# Patient Record
Sex: Female | Born: 1975 | Hispanic: No | Smoking: Current every day smoker
Health system: Southern US, Community
[De-identification: ages and names within clinical notes are randomized; demographics above are authoritative.]

## PROBLEM LIST (undated history)

## (undated) DIAGNOSIS — J45909 Unspecified asthma, uncomplicated: Secondary | ICD-10-CM

## (undated) HISTORY — PX: WISDOM TOOTH EXTRACTION: SHX21

## (undated) HISTORY — PX: CHOLECYSTECTOMY: SHX55

## (undated) HISTORY — PX: TONSILLECTOMY: SUR1361

---
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
## 1996-05-06 HISTORY — PX: WISDOM TOOTH EXTRACTION: SHX21

## 2002-05-06 HISTORY — PX: CHOLECYSTECTOMY: SHX55

## 2012-09-02 ENCOUNTER — Ambulatory Visit: Payer: Self-pay | Admitting: Family Medicine

## 2013-11-19 DIAGNOSIS — J302 Other seasonal allergic rhinitis: Secondary | ICD-10-CM | POA: Insufficient documentation

## 2013-11-19 DIAGNOSIS — E669 Obesity, unspecified: Secondary | ICD-10-CM | POA: Insufficient documentation

## 2014-01-07 DIAGNOSIS — F419 Anxiety disorder, unspecified: Secondary | ICD-10-CM | POA: Insufficient documentation

## 2014-06-07 IMAGING — CR RIGHT ANKLE - COMPLETE 3+ VIEW
1 series · 5 of 5 positions shown · non-contrast
Comparison: none

REASON FOR EXAM: R ankle pain and swelling due to fall
COMMENTS:

PROCEDURE:     MDR - MDR ANKLE RIGHT COMPLETE  - September 02, 2012  [DATE]
RESULT:     Comparison: None

[Series 1: ap · 0.17mm/px · 5 of 5 slices shown]
[im 1/5]
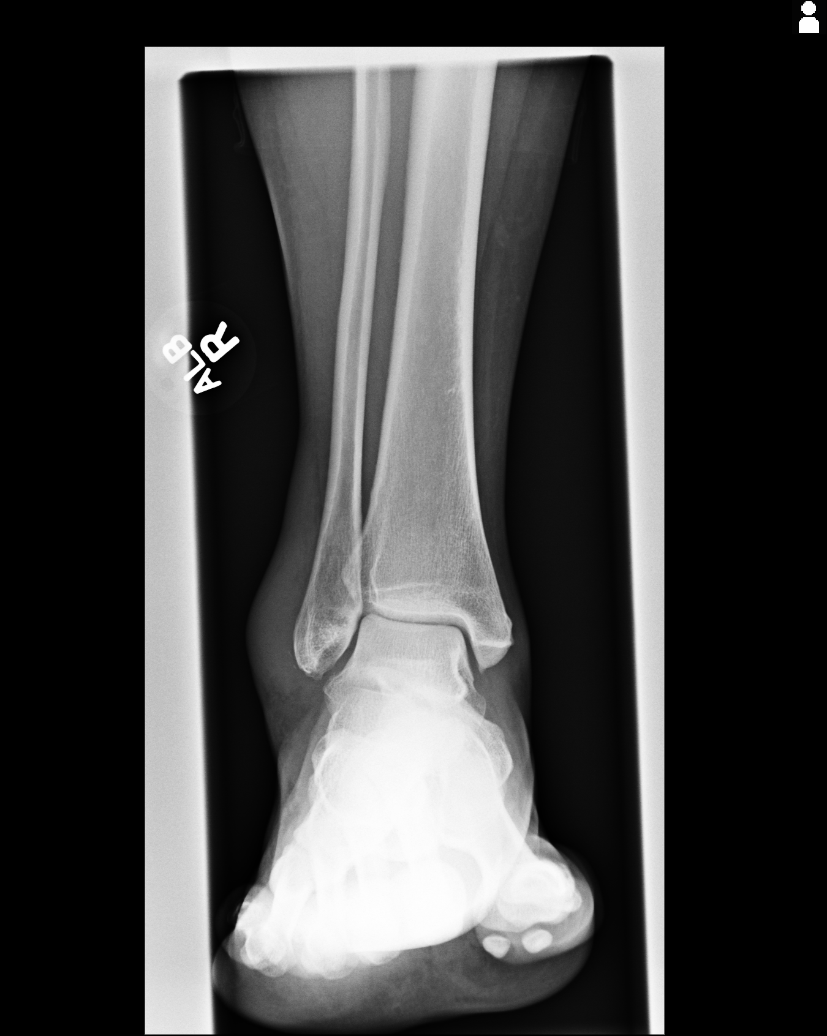
[im 2/5]
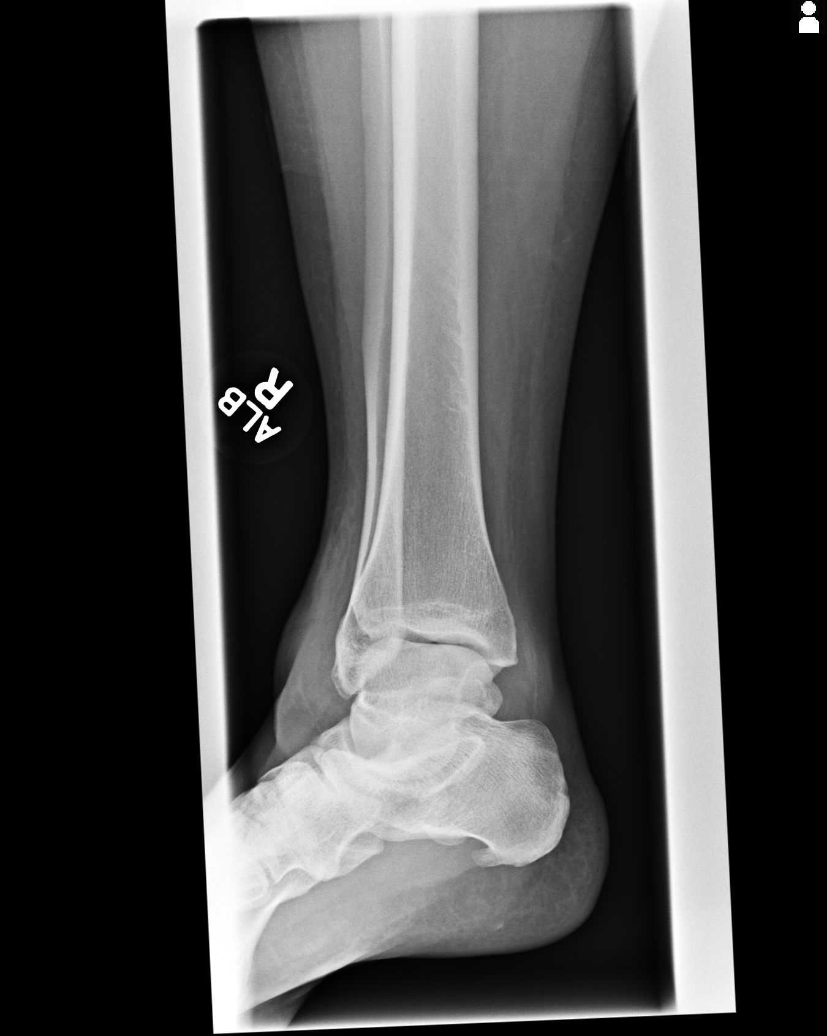
[im 3/5]
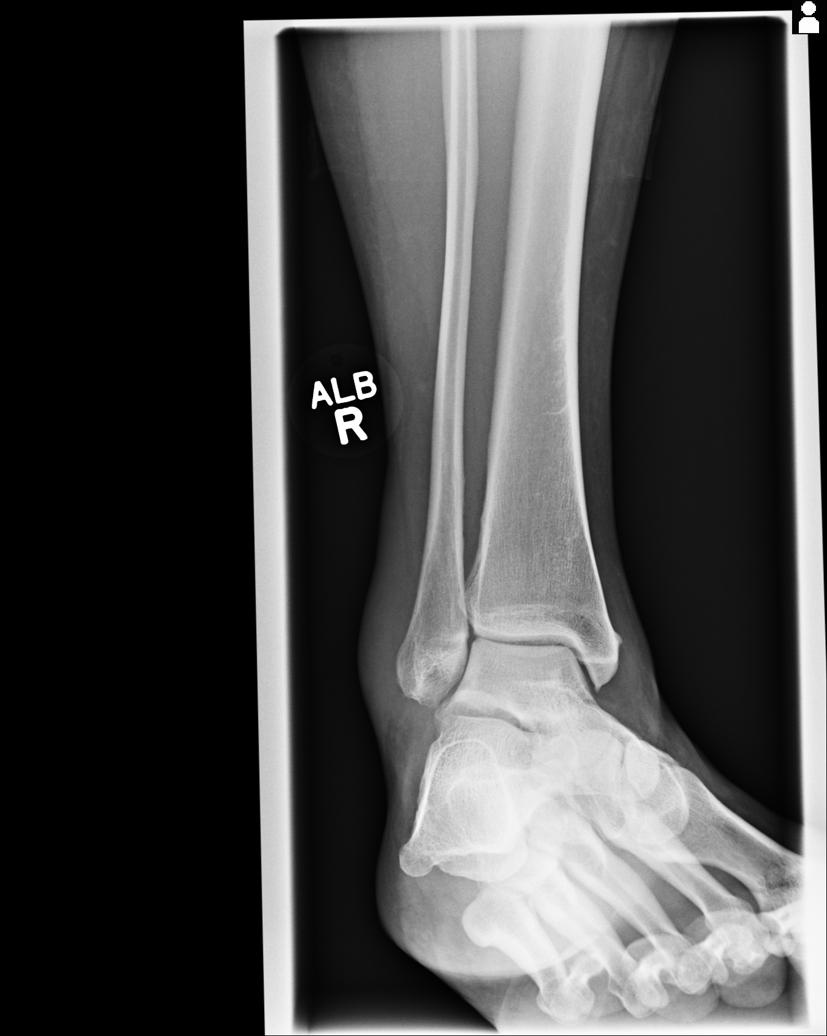
[im 4/5]
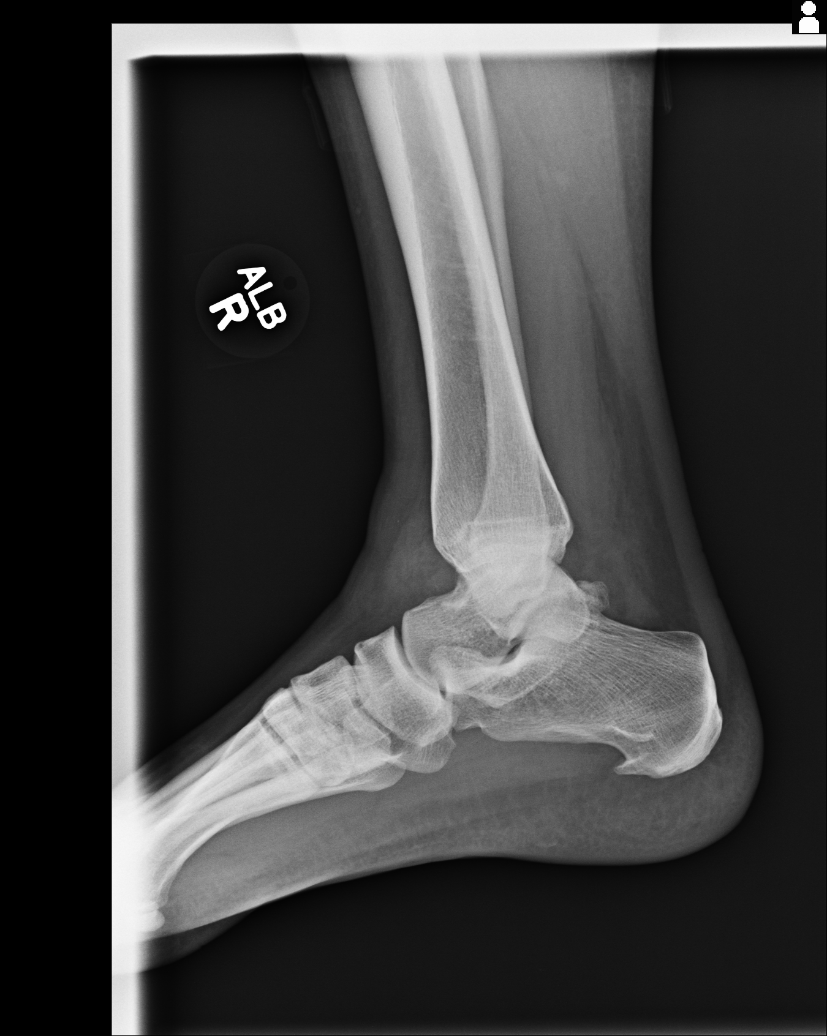
[im 5/5]
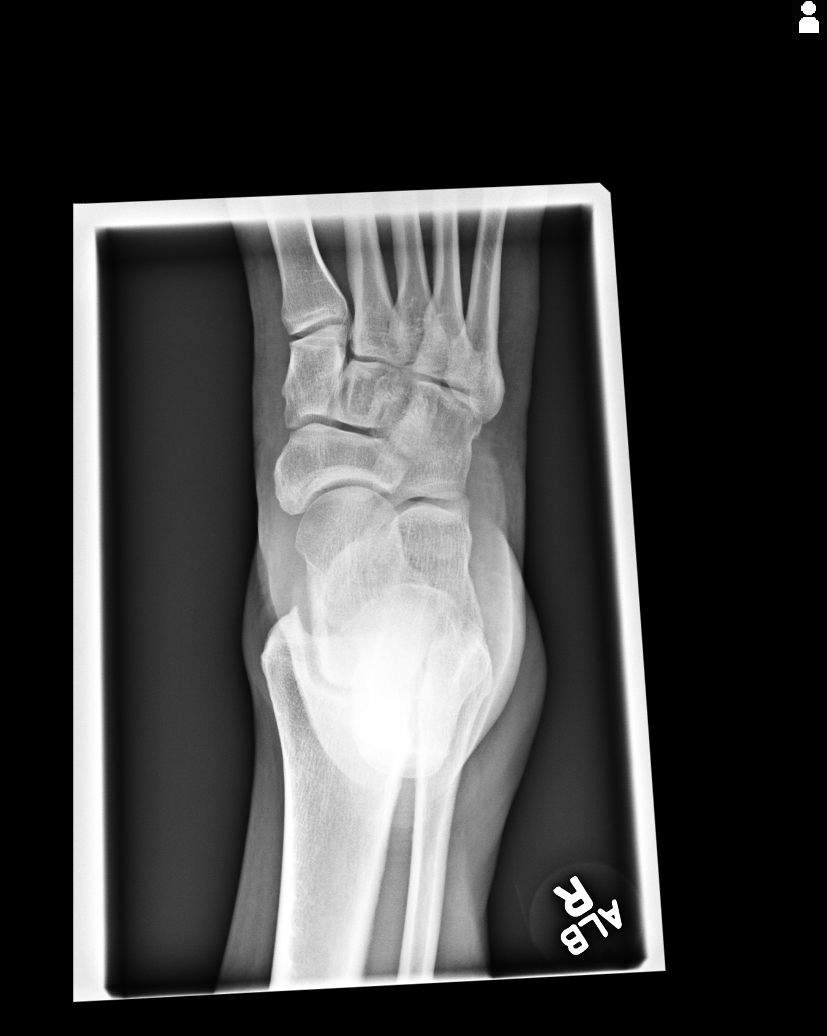

[5 of 5 positions shown; findings below may reference images not displayed]

FINDINGS: 5 views of the right ankle demonstrate 2 punctate ossific foci adjacent to
the lateral malleolus with severe overlying soft tissue swelling likely
representing avulsive injury. There is no other fracture or dislocation.
There is a plantar calcaneal spur. There ankle mortise is intact. There is
no significant joint effusion. Severe soft tissue swelling over the lateral
malleolus.
IMPRESSION: 2 punctate ossific foci adjacent to the lateral malleolus with severe
overlying soft tissue swelling likely representing avulsive injury.

[REDACTED]

## 2016-09-28 ENCOUNTER — Ambulatory Visit
Admission: EM | Admit: 2016-09-28 | Discharge: 2016-09-28 | Disposition: A | Discharge status: Home / Self Care | Payer: BLUE CROSS/BLUE SHIELD | Attending: Family Medicine | Admitting: Family Medicine

## 2016-09-28 ENCOUNTER — Encounter: Payer: Self-pay | Admitting: Emergency Medicine

## 2016-09-28 DIAGNOSIS — Z23 Encounter for immunization: Secondary | ICD-10-CM

## 2016-09-28 DIAGNOSIS — S61216A Laceration without foreign body of right little finger without damage to nail, initial encounter: Secondary | ICD-10-CM

## 2016-09-28 MED ORDER — TETANUS-DIPHTH-ACELL PERTUSSIS 5-2.5-18.5 LF-MCG/0.5 IM SUSP
0.5000 mL | Freq: Once | INTRAMUSCULAR | Status: AC
  Administered 2016-09-28: 0.5 mL via INTRAMUSCULAR

## 2016-09-28 MED ORDER — LIDOCAINE HCL (PF) 1 % IJ SOLN: 5.0000 mL | Freq: Once | INTRAMUSCULAR | Status: DC

## 2016-09-28 NOTE — Discharge Instructions (Signed)
Keep clean and dry as discussed. Use finger splint.   Return to urgent care in 7-10 days for suture removal.   Follow up with your primary care physician this week as needed. Return to Urgent care for new or worsening concerns.

## 2016-09-28 NOTE — ED Triage Notes (Signed)
Patient states that she cut her right 5th finger on a a piece of glass this morning.

## 2016-09-28 NOTE — ED Provider Notes (Signed)
[facility] ____________________________________________  Time seen: Approximately 11:40 AM  I have reviewed the triage vital signs and the nursing notes.   HISTORY  Chief Complaint Laceration (right 5th finger)   HPI Christine Boyle is a 41 y.o. female presenting for evaluation of laceration to right fifth digit that occurred just prior to arrival. Patient states that she was washing dishes at home and did not realize a class drinking cup in the sink had broken. Patient states that her right pinky hit the broken glass. Denies any concerns or foreign bodies. Reports thinks her last tetanus immunization was approximately 10 years ago. Reports minimal pain at laceration site only. Denies any decreased range of motion, paresthesias or other pain. Denies fall to the ground, head injury or loss of consciousness. Reports right hand dominant. Reports otherwise feels well.  Denies chest pain, shortness of breath, abdominal pain, dysuria, extremity pain, extremity swelling or rash. Denies recent sickness. Denies recent antibiotic use.    History reviewed. No pertinent past medical history.  There are no active problems to display for this patient.   Past Surgical History:  Procedure Laterality Date  . CESAREAN SECTION    . CHOLECYSTECTOMY    . TONSILLECTOMY    . WISDOM TOOTH EXTRACTION        Current Facility-Administered Medications:  .  lidocaine (PF) (XYLOCAINE) 1 % injection 5 mL, 5 mL, Other, Once, Renford DillsMiller, Jacklyn Branan, NP  Current Outpatient Prescriptions:  .  escitalopram (LEXAPRO) 20 MG tablet, Take 20 mg by mouth daily., Disp: , Rfl:   Allergies Patient has no known allergies.  History reviewed. No pertinent family history.  Social History Social History  Substance Use Topics  . Smoking status: Former Games developermoker  . Smokeless tobacco: Never Used  . Alcohol use Yes    Review of Systems Constitutional: No fever/chills Cardiovascular: Denies chest  pain. Respiratory: Denies shortness of breath. Gastrointestinal: No abdominal pain Genitourinary: Negative for dysuria. Musculoskeletal: Negative for back pain. Skin: As above.  ____________________________________________   PHYSICAL EXAM:  VITAL SIGNS: ED Triage Vitals  Enc Vitals Group     BP 09/28/16 1055 101/75     Pulse Rate 09/28/16 1055 68     Resp 09/28/16 1055 16     Temp 09/28/16 1055 97.9 F (36.6 C)     Temp Source 09/28/16 1055 Oral     SpO2 09/28/16 1055 100 %     Weight 09/28/16 1051 195 lb (88.5 kg)     Height 09/28/16 1051 5\' 5"  (1.651 m)     Head Circumference --      Peak Flow --      Pain Score 09/28/16 1051 1     Pain Loc --      Pain Edu? --      Excl. in GC? --     Constitutional: Alert and oriented. Well appearing and in no acute distress. ENT      Head: Normocephalic and atraumatic. Cardiovascular: Normal rate, regular rhythm. Grossly normal heart sounds.  Good peripheral circulation. Respiratory: Normal respiratory effort without tachypnea nor retractions. Breath sounds are clear and equal bilaterally. No wheezes, rales, rhonchi. Gastrointestinal: Soft and nontender. Musculoskeletal:  Steady gait.  Neurologic:  Normal speech and language. Speech is normal. No gait instability.  Skin:  Skin is warm, dry.  Except: Right lateral fifth digit proximal phalanx extending to mid phalanx flap-like laceration present 3.5 cm with mild active bleeding, no tendon visualized, and no bone visualized, no foreign bodies visualized  or found on expiration, fifth digit with full range of motion with good resisted flexion and extension and normal distal sensation. Right hand otherwise nontender. Right fifth digit no bony tenderness, minimal tenderness at laceration site only. Psychiatric: Mood and affect are normal. Speech and behavior are normal. Patient exhibits appropriate insight and judgment   ___________________________________________   LABS (all labs  ordered are listed, but only abnormal results are displayed)  Labs Reviewed - No data to display  RADIOLOGY  No results found. ____________________________________________   PROCEDURES Procedures   Procedure(s) performed:  Procedure explained and verbal consent obtained. Consent: Verbal consent obtained. Written consent not obtained. Risks and benefits: risks, benefits and alternatives were discussed Patient identity confirmed: verbally with patient and hospital-assigned identification number  Consent given by: patient   Laceration Repair Location: right fifth digit Length: 3.5 cm Foreign bodies: no foreign bodies Tendon involvement: none Nerve involvement: none Preparation: Patient was prepped and draped in the usual sterile fashion. Anesthesia with 1% Lidocaine Irrigation solution: saline and betadine Irrigation method: jet lavage Amount of cleaning: copious Repaired with 4-0 nylon  Number of sutures: 4 Technique: simple interrupted  Approximation: loose Patient tolerate well. Wound well approximated post repair.  Antibiotic ointment and dressing applied.  Wound care instructions provided.  Observe for any signs of infection or other problems.      INITIAL IMPRESSION / ASSESSMENT AND PLAN / ED COURSE  Pertinent labs & imaging results that were available during my care of the patient were reviewed by me and considered in my medical decision making (see chart for details).  Very well-appearing patient. No acute distress. Presenting for laceration right fifth digit. Laceration sustained from a drinking glass that had broken in her sink. No appearance of foreign bodies and no bony tenderness, discussed with patient evaluation by x-ray, patient declines. Patient declines concerns of foreign bodies. Tetanus immunization updated. Wound copiously irrigated and repaired. Patient tolerated well. Discussed wound care and suture removal in 7-10 days. Fareston applied to fifth  digit for support and suture protection.  Discussed follow up with Primary care physician this week. Discussed follow up and return parameters including no resolution or any worsening concerns. Patient verbalized understanding and agreed to plan.   ____________________________________________   FINAL CLINICAL IMPRESSION(S) / ED DIAGNOSES  Final diagnoses:  Laceration of right little finger without damage to nail, foreign body presence unspecified, initial encounter     Discharge Medication List as of 09/28/2016 11:38 AM      Note: This dictation was prepared with Dragon dictation along with smaller phrase technology. Any transcriptional errors that result from this process are unintentional.         Renford Dills, NP 09/28/16 1431    Renford Dills, NP 09/28/16 1432

## 2017-04-07 ENCOUNTER — Ambulatory Visit
Admission: EM | Admit: 2017-04-07 | Discharge: 2017-04-07 | Disposition: A | Discharge status: Home / Self Care | Payer: 59 | Attending: Family Medicine | Admitting: Family Medicine

## 2017-04-07 ENCOUNTER — Encounter: Payer: Self-pay | Admitting: Emergency Medicine

## 2017-04-07 ENCOUNTER — Other Ambulatory Visit: Payer: Self-pay

## 2017-04-07 DIAGNOSIS — J40 Bronchitis, not specified as acute or chronic: Secondary | ICD-10-CM | POA: Diagnosis not present

## 2017-04-07 DIAGNOSIS — R05 Cough: Secondary | ICD-10-CM | POA: Diagnosis not present

## 2017-04-07 DIAGNOSIS — J9801 Acute bronchospasm: Secondary | ICD-10-CM | POA: Diagnosis not present

## 2017-04-07 DIAGNOSIS — R059 Cough, unspecified: Secondary | ICD-10-CM

## 2017-04-07 HISTORY — DX: Unspecified asthma, uncomplicated: J45.909

## 2017-04-07 MED ORDER — ALBUTEROL SULFATE HFA 108 (90 BASE) MCG/ACT IN AERS: 1.0000 | INHALATION_SPRAY | Freq: Four times a day (QID) | RESPIRATORY_TRACT | 0 refills | Status: DC | PRN

## 2017-04-07 MED ORDER — AZITHROMYCIN 250 MG PO TABS: ORAL_TABLET | ORAL | 0 refills | Status: DC

## 2017-04-07 NOTE — ED Provider Notes (Signed)
[facility]    CSN: [id number] Arrival date & time: 04/07/17  1416     History   Chief Complaint Chief Complaint  Patient presents with  . Cough    HPI Christine Boyle is a 41 y.o. female.   The history is provided by the patient.  Cough  Associated symptoms: rhinorrhea and wheezing   URI  Presenting symptoms: congestion, cough and rhinorrhea   Severity:  Moderate Duration:  2 weeks Timing:  Constant Progression:  Worsening Chronicity:  New Relieved by:  Nothing Ineffective treatments:  OTC medications Associated symptoms: wheezing   Risk factors: sick contacts   Risk factors: not elderly, no chronic cardiac disease, no chronic kidney disease, no chronic respiratory disease, no diabetes mellitus, no immunosuppression, no recent illness and no recent travel     Past Medical History:  Diagnosis Date  . Asthma     There are no active problems to display for this patient.   Past Surgical History:  Procedure Laterality Date  . CESAREAN SECTION    . CHOLECYSTECTOMY    . TONSILLECTOMY    . WISDOM TOOTH EXTRACTION      OB History    No data available       Home Medications    Prior to Admission medications   Medication Sig Start Date End Date Taking? Authorizing Provider  albuterol (PROVENTIL HFA;VENTOLIN HFA) 108 (90 Base) MCG/ACT inhaler Inhale 1-2 puffs into the lungs every 6 (six) hours as needed for wheezing or shortness of breath. 04/07/17   Payton Mccallumonty, Kerigan Narvaez, MD  azithromycin (ZITHROMAX Z-PAK) 250 MG tablet 2 tabs po once day 1, then 1 tab po qd for next 4 days 04/07/17   Payton Mccallumonty, Obi Scrima, MD  escitalopram (LEXAPRO) 20 MG tablet Take 20 mg by mouth daily.    [provider]    Family History Family History  Problem Relation Age of Onset  . Heart defect Mother     Social History Social History   Tobacco Use  . Smoking status: Former Smoker    Last attempt to quit: 04/07/2009    Years since quitting: 8.0  . Smokeless tobacco:  Never Used  Substance Use Topics  . Alcohol use: Yes    Comment: socially  . Drug use: No     Allergies   Patient has no known allergies.   Review of Systems Review of Systems  HENT: Positive for congestion and rhinorrhea.   Respiratory: Positive for cough and wheezing.      Physical Exam Triage Vital Signs ED Triage Vitals  Enc Vitals Group     BP 04/07/17 1436 115/67     Pulse Rate 04/07/17 1434 72     Resp 04/07/17 1434 16     Temp 04/07/17 1434 97.8 F (36.6 C)     Temp Source 04/07/17 1434 Oral     SpO2 04/07/17 1434 98 %     Weight 04/07/17 1434 195 lb (88.5 kg)     Height 04/07/17 1434 5\' 5"  (1.651 m)     Head Circumference --      Peak Flow --      Pain Score 04/07/17 1435 0     Pain Loc --      Pain Edu? --      Excl. in GC? --    No data found.  Updated Vital Signs BP 115/67 (BP Location: Left Arm)   Pulse 72   Temp 97.8 F (36.6 C) (Oral)   Resp 16  Ht 5\' 5"  (1.651 m)   Wt 195 lb (88.5 kg)   LMP 03/31/2017   SpO2 98%   BMI 32.45 kg/m   Visual Acuity Right Eye Distance:   Left Eye Distance:   Bilateral Distance:    Right Eye Near:   Left Eye Near:    Bilateral Near:     Physical Exam  Constitutional: She appears well-developed and well-nourished. No distress.  HENT:  Head: Normocephalic and atraumatic.  Right Ear: Tympanic membrane, external ear and ear canal normal.  Left Ear: Tympanic membrane, external ear and ear canal normal.  Nose: No mucosal edema, rhinorrhea, nose lacerations, sinus tenderness, nasal deformity, septal deviation or nasal septal hematoma. No epistaxis.  No foreign bodies. Right sinus exhibits no maxillary sinus tenderness and no frontal sinus tenderness. Left sinus exhibits no maxillary sinus tenderness and no frontal sinus tenderness.  Mouth/Throat: Uvula is midline, oropharynx is clear and moist and mucous membranes are normal. No oropharyngeal exudate.  Eyes: Conjunctivae and EOM are normal. Pupils are equal,  round, and reactive to light. Right eye exhibits no discharge. Left eye exhibits no discharge. No scleral icterus.  Neck: Normal range of motion. Neck supple. No thyromegaly present.  Cardiovascular: Normal rate, regular rhythm and normal heart sounds.  Pulmonary/Chest: Effort normal. No respiratory distress. She has wheezes (mild diffuse plus rhonchi). She has no rales.  Lymphadenopathy:    She has no cervical adenopathy.  Skin: She is not diaphoretic.  Nursing note and vitals reviewed.    UC Treatments / Results  Labs (all labs ordered are listed, but only abnormal results are displayed) Labs Reviewed - No data to display  EKG  EKG Interpretation None       Radiology No results found.  Procedures Procedures (including critical care time)  Medications Ordered in UC Medications - No data to display   Initial Impression / Assessment and Plan / UC Course  I have reviewed the triage vital signs and the nursing notes.  Pertinent labs & imaging results that were available during my care of the patient were reviewed by me and considered in my medical decision making (see chart for details).       Final Clinical Impressions(s) / UC Diagnoses   Final diagnoses:  Cough  Bronchitis  Bronchospasm    ED Discharge Orders        Ordered    albuterol (PROVENTIL HFA;VENTOLIN HFA) 108 (90 Base) MCG/ACT inhaler  Every 6 hours PRN     04/07/17 1501    azithromycin (ZITHROMAX Z-PAK) 250 MG tablet     04/07/17 1501     1. diagnosis reviewed with patient 2. rx as per orders above; reviewed possible side effects, interactions, risks and benefits  3. Recommend supportive treatment with rest, fluids  4. Follow-up prn if symptoms worsen or don't improve  Controlled Substance Prescriptions [place] Controlled Substance Registry consulted? Not Applicable   Payton Mccallumonty, Meghan Tiemann, MD 04/07/17 [phone]

## 2017-04-07 NOTE — ED Triage Notes (Signed)
Patient in today stating that she has had a cold x 1 week, but now she is having a lot of chest congestion, coughing and wheezing. Patient took 1 dose of an old antibiotic that she had yesterday and took a puff of son's Albuterol inhaler 2 days ago. Patient is not taking anything OTC.

## 2019-07-22 ENCOUNTER — Ambulatory Visit: Payer: Self-pay | Attending: Internal Medicine

## 2019-07-22 DIAGNOSIS — Z23 Encounter for immunization: Secondary | ICD-10-CM

## 2019-07-22 NOTE — Progress Notes (Signed)
   Covid-19 Vaccination Clinic  Name:  BRANTLEIGH MIFFLIN    MRN: [id number] DOB: July 12, 1975  07/22/2019  Ms. Szumski was observed post Covid-19 immunization for 15 minutes without incident. She was provided with Vaccine Information Sheet and instruction to access the V-Safe system.   Ms. Shellhammer was instructed to call 911 with any severe reactions post vaccine: Marland Kitchen Difficulty breathing  . Swelling of face and throat  . A fast heartbeat  . A bad rash all over body  . Dizziness and weakness   Immunizations Administered    Name Date Dose VIS Date Route   Pfizer COVID-19 Vaccine 07/22/2019 11:49 AM 0.3 mL 04/16/2019 Intramuscular   Manufacturer: [employer], [employer]   Lot: [id number]   NDC: [address]-7

## 2019-08-16 ENCOUNTER — Ambulatory Visit: Payer: Self-pay | Attending: Internal Medicine

## 2019-08-16 DIAGNOSIS — R002 Palpitations: Secondary | ICD-10-CM | POA: Insufficient documentation

## 2019-08-16 DIAGNOSIS — R0789 Other chest pain: Secondary | ICD-10-CM | POA: Insufficient documentation

## 2019-08-16 DIAGNOSIS — Z720 Tobacco use: Secondary | ICD-10-CM | POA: Insufficient documentation

## 2019-08-16 DIAGNOSIS — Z23 Encounter for immunization: Secondary | ICD-10-CM

## 2019-08-16 NOTE — Progress Notes (Signed)
   Covid-19 Vaccination Clinic  Name:  Christine Boyle    MRN: [id number] DOB: 02-14-1976  08/16/2019  [name] was observed post Covid-19 immunization for 15 minutes without incident. She was provided with Vaccine Information Sheet and instruction to access the V-Safe system.   [name] was instructed to call 911 with any severe reactions post vaccine: Marland Kitchen Difficulty breathing  . Swelling of face and throat  . A fast heartbeat  . A bad rash all over body  . Dizziness and weakness   Immunizations Administered    Name Date Dose VIS Date Route   Pfizer COVID-19 Vaccine 08/16/2019 10:10 AM 0.3 mL 04/16/2019 Intramuscular   Manufacturer: [employer], [employer]   Lot: [id number]   NDC: [address]-8

## 2019-09-03 LAB — RESULTS CONSOLE HPV: CHL HPV: NEGATIVE

## 2019-09-03 LAB — HM PAP SMEAR: HM Pap smear: NORMAL

## 2020-10-18 DIAGNOSIS — I8393 Asymptomatic varicose veins of bilateral lower extremities: Secondary | ICD-10-CM | POA: Insufficient documentation

## 2020-10-18 DIAGNOSIS — J4520 Mild intermittent asthma, uncomplicated: Secondary | ICD-10-CM | POA: Insufficient documentation

## 2021-05-03 ENCOUNTER — Ambulatory Visit
Admission: EM | Admit: 2021-05-03 | Discharge: 2021-05-03 | Disposition: A | Discharge status: Home / Self Care | Payer: 59 | Attending: Emergency Medicine | Admitting: Emergency Medicine

## 2021-05-03 ENCOUNTER — Other Ambulatory Visit: Payer: Self-pay

## 2021-05-03 DIAGNOSIS — J069 Acute upper respiratory infection, unspecified: Secondary | ICD-10-CM

## 2021-05-03 DIAGNOSIS — J4521 Mild intermittent asthma with (acute) exacerbation: Secondary | ICD-10-CM | POA: Diagnosis not present

## 2021-05-03 LAB — HM HEPATITIS C SCREENING LAB: HM Hepatitis Screen: NEGATIVE

## 2021-05-03 MED ORDER — PREDNISONE 20 MG PO TABS: 40.0000 mg | ORAL_TABLET | Freq: Every day | ORAL | 0 refills | Status: DC

## 2021-05-03 MED ORDER — BENZONATATE 100 MG PO CAPS: 100.0000 mg | ORAL_CAPSULE | Freq: Three times a day (TID) | ORAL | 0 refills | Status: DC

## 2021-05-03 MED ORDER — PROMETHAZINE-DM 6.25-15 MG/5ML PO SYRP: 5.0000 mL | ORAL_SOLUTION | Freq: Four times a day (QID) | ORAL | 0 refills | Status: DC | PRN

## 2021-05-03 NOTE — Discharge Instructions (Signed)
Been exposed to a virus which will work its way out of your body over time, this virus has caused her asthma to flare which has worsened her breathing  Begin use of prednisone tomorrow morning, take every morning with food for the next 5 days, this medication helps to reduce inflammation and irritation of your upper airways  Continue use of your albuterol inhaler as needed for shortness of breath and wheezing  You may use Tessalon pill every 8 hours to help calm coughing  You may use syrup every 6 hours as needed to help calm coughing, be mindful of this medication make you drowsy, if that occurs you may use at bedtime  In addition may try a teaspoon of honey, steam sitting in a steam shower, use of a humidifier and increasing fluid intake to further help congestion and cough  You may follow-up with urgent care as needed for persistent symptoms

## 2021-05-03 NOTE — ED Provider Notes (Signed)
[facility]    CSN: [id number] Arrival date & time: 05/03/21  1339      History   Chief Complaint Chief Complaint  Patient presents with   Cough    HPI Christine Boyle is a 45 y.o. female.   Patient presents with nasal congestion, mild sore throat, productive cough, increased shortness of breath, wheezing and chest tightness for 7 days.   symptoms have been worsened.  Endorses that she has begun to smoke cigarettes due to stressful life event.  Has attempted use of albuterol inhaler and over-the-counter medications which have been somewhat helpful.  History of asthma.  Denies fever, chills, body aches, abdominal pain, nausea, vomiting, diarrhea, headaches.  Past Medical History:  Diagnosis Date   Asthma     There are no problems to display for this patient.   Past Surgical History:  Procedure Laterality Date   CESAREAN SECTION     CHOLECYSTECTOMY     TONSILLECTOMY     WISDOM TOOTH EXTRACTION      OB History   No obstetric history on file.      Home Medications    Prior to Admission medications   Medication Sig Start Date End Date Taking? Authorizing Provider  albuterol (PROVENTIL HFA;VENTOLIN HFA) 108 (90 Base) MCG/ACT inhaler Inhale 1-2 puffs into the lungs every 6 (six) hours as needed for wheezing or shortness of breath. 04/07/17  Yes Conty, Pamala Hurry, MD  escitalopram (LEXAPRO) 20 MG tablet Take 20 mg by mouth daily.   Yes [provider]  azithromycin (ZITHROMAX Z-PAK) 250 MG tablet 2 tabs po once day 1, then 1 tab po qd for next 4 days 04/07/17   Payton Mccallum, MD    Family History Family History  Problem Relation Age of Onset   Heart defect Mother     Social History Social History   Tobacco Use   Smoking status: Every Day    Types: Cigarettes    Last attempt to quit: 04/07/2009    Years since quitting: 12.0   Smokeless tobacco: Never  Vaping Use   Vaping Use: Never used  Substance Use Topics   Alcohol use: Yes    Comment:  socially   Drug use: No     Allergies   Patient has no known allergies.   Review of Systems Review of Systems  Constitutional: Negative.   HENT:  Positive for congestion and sore throat. Negative for dental problem, drooling, ear discharge, ear pain, facial swelling, hearing loss, mouth sores, nosebleeds, postnasal drip, rhinorrhea, sinus pressure, sinus pain, sneezing, tinnitus, trouble swallowing and voice change.   Respiratory:  Positive for cough, chest tightness, shortness of breath and wheezing. Negative for apnea, choking and stridor.   Cardiovascular: Negative.   Gastrointestinal: Negative.   Skin: Negative.   Neurological: Negative.     Physical Exam Triage Vital Signs ED Triage Vitals  Enc Vitals Group     BP 05/03/21 1513 116/85     Pulse Rate 05/03/21 1513 78     Resp 05/03/21 1513 18     Temp 05/03/21 1513 98.6 F (37 C)     Temp Source 05/03/21 1513 Oral     SpO2 05/03/21 1513 97 %     Weight 05/03/21 1511 180 lb (81.6 kg)     Height 05/03/21 1511 5\' 6"  (1.676 m)     Head Circumference --      Peak Flow --      Pain Score 05/03/21 1511 0  Pain Loc --      Pain Edu? --      Excl. in GC? --    No data found.  Updated Vital Signs BP 116/85 (BP Location: Left Arm)    Pulse 78    Temp 98.6 F (37 C) (Oral)    Resp 18    Ht 5\' 6"  (1.676 m)    Wt 180 lb (81.6 kg)    LMP 04/03/2021    SpO2 97%    BMI 29.05 kg/m   Visual Acuity Right Eye Distance:   Left Eye Distance:   Bilateral Distance:    Right Eye Near:   Left Eye Near:    Bilateral Near:     Physical Exam Constitutional:      Appearance: Normal appearance.  HENT:     Head: Normocephalic.     Right Ear: Tympanic membrane, ear canal and external ear normal.     Left Ear: Tympanic membrane, ear canal and external ear normal.     Nose: Congestion present.     Mouth/Throat:     Mouth: Mucous membranes are moist.     Pharynx: Oropharynx is clear.  Eyes:     Extraocular Movements: Extraocular  movements intact.  Cardiovascular:     Rate and Rhythm: Normal rate and regular rhythm.     Pulses: Normal pulses.     Heart sounds: Normal heart sounds.  Pulmonary:     Effort: Pulmonary effort is normal.     Breath sounds: Normal breath sounds.  Musculoskeletal:     Cervical back: Normal range of motion and neck supple.  Skin:    General: Skin is warm and dry.  Neurological:     General: No focal deficit present.     Mental Status: She is alert and oriented to person, place, and time. Mental status is at baseline.  Psychiatric:        Mood and Affect: Mood normal.        Behavior: Behavior normal.     UC Treatments / Results  Labs (all labs ordered are listed, but only abnormal results are displayed) Labs Reviewed - No data to display  EKG   Radiology No results found.  Procedures Procedures (including critical care time)  Medications Ordered in UC Medications - No data to display  Initial Impression / Assessment and Plan / UC Course  I have reviewed the triage vital signs and the nursing notes.  Pertinent labs & imaging results that were available during my care of the patient were reviewed by me and considered in my medical decision making (see chart for details).  Viral URI with cough Mild intermittent asthma exacerbation   Vital signs are stable, O2 saturation 97% on room air, lungs are clear during exam, stable for outpatient management, will defer imaging at this time etiology of symptoms most likely viral with tobacco usage causing asthmatic flare, discussed with patient, advised continue use of albuterol inhaler, prescribed 5-day prednisone course, Tessalon and Promethazine DM for management of cough, may continue use of over-the-counter medications as needed, urgent care follow-up as needed Final Clinical Impressions(s) / UC Diagnoses   Final diagnoses:  None   Discharge Instructions   None    ED Prescriptions   None    PDMP not reviewed this  encounter.   04/05/2021, Valinda Hoar 05/03/21 [phone]

## 2021-05-03 NOTE — ED Triage Notes (Signed)
Pt c/o cough at night, chest congestion, sob. Pt has asthma and smokes and is on an inhaler x1week.

## 2022-03-18 ENCOUNTER — Encounter: Payer: Self-pay | Admitting: General Practice

## 2022-03-25 ENCOUNTER — Encounter: Payer: Self-pay | Admitting: Family Medicine

## 2022-03-25 ENCOUNTER — Ambulatory Visit: Payer: 59 | Admitting: Family Medicine

## 2022-03-25 VITALS — BP 132/82 | HR 96 | Ht 66.0 in | Wt 215.0 lb

## 2022-03-25 DIAGNOSIS — J302 Other seasonal allergic rhinitis: Secondary | ICD-10-CM

## 2022-03-25 DIAGNOSIS — M5412 Radiculopathy, cervical region: Secondary | ICD-10-CM | POA: Insufficient documentation

## 2022-03-25 DIAGNOSIS — J4520 Mild intermittent asthma, uncomplicated: Secondary | ICD-10-CM

## 2022-03-25 DIAGNOSIS — F439 Reaction to severe stress, unspecified: Secondary | ICD-10-CM | POA: Insufficient documentation

## 2022-03-25 DIAGNOSIS — F32A Depression, unspecified: Secondary | ICD-10-CM | POA: Insufficient documentation

## 2022-03-25 DIAGNOSIS — Z720 Tobacco use: Secondary | ICD-10-CM | POA: Diagnosis not present

## 2022-03-25 MED ORDER — FLUTICASONE-SALMETEROL 250-50 MCG/ACT IN AEPB: 1.0000 | INHALATION_SPRAY | Freq: Two times a day (BID) | RESPIRATORY_TRACT | 2 refills | Status: DC

## 2022-03-25 MED ORDER — ALBUTEROL SULFATE HFA 108 (90 BASE) MCG/ACT IN AERS: 1.0000 | INHALATION_SPRAY | Freq: Four times a day (QID) | RESPIRATORY_TRACT | 0 refills | Status: DC | PRN

## 2022-03-25 MED ORDER — MELOXICAM 15 MG PO TABS: 15.0000 mg | ORAL_TABLET | Freq: Every day | ORAL | 0 refills | Status: DC

## 2022-03-25 MED ORDER — CYCLOBENZAPRINE HCL 10 MG PO TABS: 10.0000 mg | ORAL_TABLET | Freq: Every evening | ORAL | 0 refills | Status: DC | PRN

## 2022-03-25 NOTE — Assessment & Plan Note (Signed)
Chronic, stable on current regimen, medications refilled.

## 2022-03-26 ENCOUNTER — Encounter: Payer: Self-pay | Admitting: Family Medicine

## 2022-03-26 ENCOUNTER — Other Ambulatory Visit: Payer: 59

## 2022-03-26 NOTE — Patient Instructions (Addendum)
-   Obtain neck x-ray - Dose meloxicam daily with food - Use cyclobenzaprine nightly as-needed - Start home exercises - Return for physical

## 2022-03-26 NOTE — Assessment & Plan Note (Signed)
Acute on chronic right neck and shoulder pain with radiation to the right digits 1 and 2, atraumatic in onset, has history of the same from the past. Examination with positive right-sided Spurling's test, otherwise sensorimotor intact bilateral upper extremities, her shoulder exam does show equivocal impingement testing with pain during supraspinatus stressing, 5/5 RC strength noted.  Plan for x-ray of the cervical spine, meloxicam course, and PRN cyclobenzaprine. Home exercises provided, she will return for physical and we can follow-up on this issue at that time.

## 2022-03-26 NOTE — Assessment & Plan Note (Signed)
Patient has cutback on smoking but continues to do so, has had a decrease in life stressors which has helped. We reviewed pharmacotherapeutic options and she is open to revisiting this at her physical early next year but is not currently ready to quit. Discussion x 3 minutes.

## 2022-03-26 NOTE — Progress Notes (Signed)
Primary Care / Sports Medicine Office Visit  Patient Information:  Patient ID: Christine Boyle, female DOB: [birth year] Age: 46 y.o. MRN: [id number]   Christine Boyle is a pleasant 46 y.o. female presenting with the following:  Chief Complaint  Patient presents with   Establish Care   nerve pain    Right arm/shoulder down to finger tips, left one is starting   Nicotine Dependence    Vitals:   03/25/22 1345  BP: 132/82  Pulse: 96  SpO2: 98%   Vitals:   03/25/22 1345  Weight: 215 lb (97.5 kg)  Height: 5\' 6"  (1.676 m)   Body mass index is 34.7 kg/m.  No results found.   Independent interpretation of notes and tests performed by another provider:   None  Procedures performed:   None  Pertinent History, Exam, Impression, and Recommendations:   Problem List Items Addressed This Visit       Respiratory   Mild intermittent asthma without complication    Chronic, stable on current regimen, medications refilled.      Relevant Medications   albuterol (VENTOLIN HFA) 108 (90 Base) MCG/ACT inhaler   fluticasone-salmeterol (ADVAIR) 250-50 MCG/ACT AEPB   Seasonal allergic rhinitis   Relevant Medications   albuterol (VENTOLIN HFA) 108 (90 Base) MCG/ACT inhaler   fluticasone-salmeterol (ADVAIR) 250-50 MCG/ACT AEPB     Nervous and Auditory   Right cervical radiculopathy - Primary    Acute on chronic right neck and shoulder pain with radiation to the right digits 1 and 2, atraumatic in onset, has history of the same from the past. Examination with positive right-sided Spurling's test, otherwise sensorimotor intact bilateral upper extremities, her shoulder exam does show equivocal impingement testing with pain during supraspinatus stressing, 5/5 RC strength noted.  Plan for x-ray of the cervical spine, meloxicam course, and PRN cyclobenzaprine. Home exercises provided, she will return for physical and we can follow-up on this issue at that time.      Relevant Medications    meloxicam (MOBIC) 15 MG tablet   cyclobenzaprine (FLEXERIL) 10 MG tablet   Other Relevant Orders   DG Cervical Spine Complete     Other   Tobacco use    Patient has cutback on smoking but continues to do so, has had a decrease in life stressors which has helped. We reviewed pharmacotherapeutic options and she is open to revisiting this at her physical early next year but is not currently ready to quit. Discussion x 3 minutes.        Orders & Medications Meds ordered this encounter  Medications   albuterol (VENTOLIN HFA) 108 (90 Base) MCG/ACT inhaler    Sig: Inhale 1-2 puffs into the lungs every 6 (six) hours as needed for wheezing or shortness of breath.    Dispense:  1 each    Refill:  0   fluticasone-salmeterol (ADVAIR) 250-50 MCG/ACT AEPB    Sig: Inhale 1 puff into the lungs in the morning and at bedtime.    Dispense:  60 each    Refill:  2   meloxicam (MOBIC) 15 MG tablet    Sig: Take 1 tablet (15 mg total) by mouth daily.    Dispense:  30 tablet    Refill:  0   cyclobenzaprine (FLEXERIL) 10 MG tablet    Sig: Take 1 tablet (10 mg total) by mouth at bedtime as needed for muscle spasms.    Dispense:  30 tablet    Refill:  0  Orders Placed This Encounter  Procedures   DG Cervical Spine Complete     Return in about 2 months (around 05/25/2022) for CPE.     Jerrol Banana, MD   Primary Care Sports Medicine [facility]

## 2022-04-04 ENCOUNTER — Telehealth: Payer: Self-pay | Admitting: Family Medicine

## 2022-04-04 NOTE — Telephone Encounter (Signed)
Copied from CRM #[id number]. Topic: General - Other >> Apr 04, 2022  9:14 AM Macon Large wrote: Reason for CRM: Pt stated she was told to call back if she would like a Rx for Wellbutrin. Pt requests that the Rx be sent to [id number] - [place], [place] - [address] AT [facility] [address]  [phone]  [phone]

## 2022-04-04 NOTE — Telephone Encounter (Signed)
Please advise 

## 2022-04-05 ENCOUNTER — Other Ambulatory Visit: Payer: Self-pay | Admitting: Family Medicine

## 2022-04-05 DIAGNOSIS — Z720 Tobacco use: Secondary | ICD-10-CM

## 2022-04-05 MED ORDER — BUPROPION HCL ER (SR) 150 MG PO TB12: ORAL_TABLET | ORAL | 1 refills | Status: DC

## 2022-04-05 NOTE — Telephone Encounter (Signed)
Rx sent 

## 2022-04-08 ENCOUNTER — Other Ambulatory Visit: Payer: Self-pay

## 2022-04-14 ENCOUNTER — Other Ambulatory Visit: Payer: Self-pay | Admitting: Family Medicine

## 2022-04-14 DIAGNOSIS — J302 Other seasonal allergic rhinitis: Secondary | ICD-10-CM

## 2022-04-14 DIAGNOSIS — J4520 Mild intermittent asthma, uncomplicated: Secondary | ICD-10-CM

## 2022-04-15 ENCOUNTER — Ambulatory Visit
Admission: RE | Admit: 2022-04-15 | Discharge: 2022-04-15 | Disposition: A | Discharge status: Home / Self Care | Payer: 59 | Attending: Family Medicine | Admitting: Family Medicine

## 2022-04-15 ENCOUNTER — Ambulatory Visit
Admission: RE | Admit: 2022-04-15 | Discharge: 2022-04-15 | Disposition: A | Discharge status: Home / Self Care | Payer: 59 | Source: Ambulatory Visit | Attending: Family Medicine | Admitting: Family Medicine

## 2022-04-15 ENCOUNTER — Encounter: Payer: Self-pay | Admitting: Family Medicine

## 2022-04-15 DIAGNOSIS — M5412 Radiculopathy, cervical region: Secondary | ICD-10-CM | POA: Insufficient documentation

## 2022-04-15 NOTE — Telephone Encounter (Signed)
Please review.  KP

## 2022-04-16 ENCOUNTER — Ambulatory Visit: Payer: 59 | Admitting: Family Medicine

## 2022-04-16 NOTE — Telephone Encounter (Signed)
Requested Prescriptions  Pending Prescriptions Disp Refills   albuterol (VENTOLIN HFA) 108 (90 Base) MCG/ACT inhaler [Pharmacy Med Name: ALBUTEROL HFA INH (200 PUFFS) 6.7GM] 6.7 g 0    Sig: INHALE 1 TO 2 PUFFS INTO THE LUNGS EVERY 6 HOURS AS NEEDED FOR WHEEZING OR SHORTNESS OF BREATH     Pulmonology:  Beta Agonists 2 Passed - 04/14/2022 12:52 PM      Passed - Last BP in normal range    BP Readings from Last 1 Encounters:  03/25/22 132/82         Passed - Last Heart Rate in normal range    Pulse Readings from Last 1 Encounters:  03/25/22 96         Passed - Valid encounter within last 12 months    Recent Outpatient Visits           3 weeks ago Right cervical radiculopathy   [place] [facility] at [facility], Ocie Bob, MD       Future Appointments             Tomorrow Jerrol Banana, MD [facility] Primary Care and Sports Medicine at [facility], [facility]   In 1 month Ashley Royalty, Ocie Bob, MD [facility] Primary Care and Sports Medicine at [facility], [facility]

## 2022-04-17 ENCOUNTER — Encounter: Payer: Self-pay | Admitting: Family Medicine

## 2022-04-17 ENCOUNTER — Ambulatory Visit: Payer: 59 | Admitting: Family Medicine

## 2022-04-17 ENCOUNTER — Inpatient Hospital Stay (INDEPENDENT_AMBULATORY_CARE_PROVIDER_SITE_OTHER): Payer: 59 | Admitting: Radiology

## 2022-04-17 VITALS — BP 130/80 | HR 90 | Ht 66.0 in | Wt 217.0 lb

## 2022-04-17 DIAGNOSIS — M5412 Radiculopathy, cervical region: Secondary | ICD-10-CM

## 2022-04-17 DIAGNOSIS — M7521 Bicipital tendinitis, right shoulder: Secondary | ICD-10-CM

## 2022-04-17 DIAGNOSIS — M7581 Other shoulder lesions, right shoulder: Secondary | ICD-10-CM | POA: Diagnosis not present

## 2022-04-17 MED ORDER — CELECOXIB 100 MG PO CAPS: 100.0000 mg | ORAL_CAPSULE | Freq: Two times a day (BID) | ORAL | 0 refills | Status: DC

## 2022-04-17 MED ORDER — METHOCARBAMOL 500 MG PO TABS: 500.0000 mg | ORAL_TABLET | Freq: Three times a day (TID) | ORAL | 0 refills | Status: DC | PRN

## 2022-04-17 NOTE — Patient Instructions (Signed)
You have just been given a cortisone injection to reduce pain and inflammation. After the injection you may notice immediate relief of pain as a result of the Lidocaine. It is important to rest the area of the injection for 24 to 48 hours after the injection. There is a possibility of some temporary increased discomfort and swelling for up to 72 hours until the cortisone begins to work. If you do have pain, simply rest the joint and use ice. If you can tolerate over the counter medications, you can try Tylenol for added relief per package instructions. - Stop meloxicam and cyclobenzaprine - Start Celebrex twice daily  - Use methocarbamol three times daily as-needed - Start physical therapy, contact number below for expedited scheduling - Return as scheduled

## 2022-04-19 ENCOUNTER — Other Ambulatory Visit: Payer: Self-pay | Admitting: Family Medicine

## 2022-04-19 DIAGNOSIS — M5412 Radiculopathy, cervical region: Secondary | ICD-10-CM

## 2022-04-19 NOTE — Telephone Encounter (Signed)
Requested by interface surescripts. Medication discontinued 04/17/22. Requested Prescriptions  Refused Prescriptions Disp Refills   meloxicam (MOBIC) 15 MG tablet [Pharmacy Med Name: MELOXICAM 15MG TABLETS] 30 tablet 0    Sig: TAKE 1 TABLET(15 MG) BY MOUTH DAILY     Analgesics:  COX2 Inhibitors Failed - 04/19/2022  5:03 PM      Failed - Manual Review: Labs are only required if the patient has taken medication for more than 8 weeks.      Failed - HGB in normal range and within 360 days    No results found for: "HGB", "HGBKUC", "HGBPOCKUC", "HGBOTHER", "TOTHGB", "HGBPLASMA"       Failed - Cr in normal range and within 360 days    No results found for: "CREATININE", "LABCREAU", "LABCREA", "POCCRE"       Failed - HCT in normal range and within 360 days    No results found for: "HCT", "HCTKUC", "SRHCT"       Failed - AST in normal range and within 360 days    No results found for: "POCAST", "AST"       Failed - ALT in normal range and within 360 days    No results found for: "ALT", "LABALT", "POCALT"       Failed - eGFR is 30 or above and within 360 days    No results found for: "GFRAA", "GFRNONAA", "GFR", "EGFR"       Passed - Patient is not pregnant      Passed - Valid encounter within last 12 months    Recent Outpatient Visits           2 days ago Rotator cuff tendinitis, right   [place] [facility] at [place], [place]   3 weeks ago Right cervical radiculopathy   [place] [facility] and Sports Medicine at [facility], Earley Abide, MD       Future Appointments             In 1 month Zigmund Daniel, Earley Abide, MD [place] [facility] and Sports Medicine at [facility], [facility]

## 2022-04-19 NOTE — Assessment & Plan Note (Signed)
Performed ultrasound-guided diagnostic and theraputic injection today. See additional assessment(s) for plan details. 

## 2022-04-19 NOTE — Assessment & Plan Note (Signed)
Performed ultrasound-guided diagnostic and theraputic injection today. See additional assessment(s) for plan details.

## 2022-04-19 NOTE — Assessment & Plan Note (Signed)
Returns for acute on chronic right neck and shoulder pain. To address comorbid right rotator cuff and biceps tendinitis we performed diagnostic and therapeutic corticosteroid injections at right biceps tendon sheath and subacromial spaces. From a medication management standpoint modified to Celebrex and Robaxin. Did advise need for spine group.

## 2022-04-19 NOTE — Progress Notes (Signed)
Primary Care / Sports Medicine Office Visit  Patient Information:  Patient ID: Christine Boyle, female DOB: 08-14-1975 Age: 46 y.o. MRN: [id number]   Christine Boyle is a pleasant 46 y.o. female presenting with the following:  Chief Complaint  Patient presents with   Right cervical radiculopathy    Vitals:   04/17/22 1015  BP: 130/80  Pulse: 90  SpO2: 98%   Vitals:   04/17/22 1015  Weight: 217 lb (98.4 kg)  Height: 5\' 6"  (1.676 m)   Body mass index is 35.02 kg/m.     Independent interpretation of notes and tests performed by another provider:   None  Procedures performed:   Procedure:  Injection of right biceps tendon sheath under ultrasound guidance. Ultrasound guidance utilized, trace peritendinous hypoechoic region consistent with effusion Samsung HS60 device utilized with permanent recording / reporting. Verbal informed consent obtained and verified. Skin prepped in a sterile fashion. Ethyl chloride for topical local analgesia.  Completed without difficulty and tolerated well. Medication: triamcinolone acetonide 40 mg/mL suspension for injection 1 mL total and 2 mL lidocaine 1% without epinephrine utilized for needle placement anesthetic Advised to contact for fevers/chills, erythema, induration, drainage, or persistent bleeding.  Procedure:  Injection of right subacromial space under ultrasound guidance. Ultrasound guidance used for in-plane approach, no bursitis Samsung HS60 device utilized with permanent recording / reporting. Verbal informed consent obtained and verified. Skin prepped in a sterile fashion. Ethyl chloride for topical local analgesia.  Completed without difficulty and tolerated well. Medication: triamcinolone acetonide 40 mg/mL suspension for injection 1 mL total and 2 mL lidocaine 1% without epinephrine utilized for needle placement anesthetic Advised to contact for fevers/chills, erythema, induration, drainage, or persistent  bleeding.   Pertinent History, Exam, Impression, and Recommendations:   Problem List Items Addressed This Visit       Nervous and Auditory   Right cervical radiculopathy    Returns for acute on chronic right neck and shoulder pain. To address comorbid right rotator cuff and biceps tendinitis we performed diagnostic and therapeutic corticosteroid injections at right biceps tendon sheath and subacromial spaces. From a medication management standpoint modified to Celebrex and Robaxin. Did advise need for spine group.      Relevant Medications   celecoxib (CELEBREX) 100 MG capsule   methocarbamol (ROBAXIN) 500 MG tablet   Other Relevant Orders   Ambulatory referral to Physical Therapy     Musculoskeletal and Integument   Rotator cuff tendinitis, right - Primary    Performed ultrasound-guided diagnostic and theraputic injection today. See additional assessment(s) for plan details.      Relevant Medications   celecoxib (CELEBREX) 100 MG capsule   methocarbamol (ROBAXIN) 500 MG tablet   Other Relevant Orders   LIMITED JOINT SPACE STRUCTURES UP RIGHT   Ambulatory referral to Physical Therapy   Biceps tendinitis, right    Performed ultrasound-guided diagnostic and theraputic injection today. See additional assessment(s) for plan details.      Relevant Medications   celecoxib (CELEBREX) 100 MG capsule   methocarbamol (ROBAXIN) 500 MG tablet   Other Relevant Orders   [place] LIMITED JOINT SPACE STRUCTURES UP RIGHT   Ambulatory referral to Physical Therapy     Orders & Medications Meds ordered this encounter  Medications   celecoxib (CELEBREX) 100 MG capsule    Sig: Take 1 capsule (100 mg total) by mouth 2 (two) times daily.    Dispense:  60 capsule    Refill:  0  methocarbamol (ROBAXIN) 500 MG tablet    Sig: Take 1 tablet (500 mg total) by mouth every 8 (eight) hours as needed for muscle spasms.    Dispense:  90 tablet    Refill:  0   Orders Placed This Encounter   Procedures   [place] LIMITED JOINT SPACE STRUCTURES UP RIGHT   Ambulatory referral to Physical Therapy     Return as scheduled.     Jerrol Banana, MD, [facility]   Primary Care Sports Medicine Primary Care and Sports Medicine at [facility]

## 2022-04-22 ENCOUNTER — Ambulatory Visit: Payer: 59 | Admitting: Family Medicine

## 2022-04-23 LAB — HM HIV SCREENING LAB: HM HIV Screening: NEGATIVE

## 2022-05-19 ENCOUNTER — Other Ambulatory Visit: Payer: Self-pay | Admitting: Family Medicine

## 2022-05-19 DIAGNOSIS — J302 Other seasonal allergic rhinitis: Secondary | ICD-10-CM

## 2022-05-19 DIAGNOSIS — J4520 Mild intermittent asthma, uncomplicated: Secondary | ICD-10-CM

## 2022-05-20 NOTE — Telephone Encounter (Signed)
Requested Prescriptions  Pending Prescriptions Disp Refills   albuterol (VENTOLIN HFA) 108 (90 Base) MCG/ACT inhaler [Pharmacy Med Name: ALBUTEROL HFA INH (200 PUFFS) 6.7GM] 6.7 g 2    Sig: INHALE 1 TO 2 PUFFS INTO THE LUNGS EVERY 6 HOURS AS NEEDED FOR WHEEZING OR SHORTNESS OF BREATH     Pulmonology:  Beta Agonists 2 Passed - 05/19/2022  1:19 PM      Passed - Last BP in normal range    BP Readings from Last 1 Encounters:  04/17/22 130/80         Passed - Last Heart Rate in normal range    Pulse Readings from Last 1 Encounters:  04/17/22 90         Passed - Valid encounter within last 12 months    Recent Outpatient Visits           1 month ago Rotator cuff tendinitis, right   [place] [facility] Medicine at [facility], Earley Abide, MD   1 month ago Right cervical radiculopathy   [place] [facility] Care and Sports Medicine at [facility], Earley Abide, MD       Future Appointments             In 3 weeks Zigmund Daniel, Earley Abide, MD [place] at [facility], [facility]

## 2022-05-24 LAB — HM COLONOSCOPY

## 2022-06-07 ENCOUNTER — Encounter: Payer: 59 | Admitting: Family Medicine

## 2022-06-14 ENCOUNTER — Encounter: Payer: Self-pay | Admitting: Family Medicine

## 2022-06-14 ENCOUNTER — Ambulatory Visit: Payer: 59 | Admitting: Family Medicine

## 2022-06-14 VITALS — BP 110/78 | HR 82 | Ht 66.0 in | Wt 207.0 lb

## 2022-06-14 DIAGNOSIS — Z23 Encounter for immunization: Secondary | ICD-10-CM

## 2022-06-14 DIAGNOSIS — Z1159 Encounter for screening for other viral diseases: Secondary | ICD-10-CM

## 2022-06-14 DIAGNOSIS — M5412 Radiculopathy, cervical region: Secondary | ICD-10-CM

## 2022-06-14 DIAGNOSIS — R946 Abnormal results of thyroid function studies: Secondary | ICD-10-CM

## 2022-06-14 DIAGNOSIS — R7301 Impaired fasting glucose: Secondary | ICD-10-CM

## 2022-06-14 DIAGNOSIS — Z13220 Encounter for screening for lipoid disorders: Secondary | ICD-10-CM | POA: Diagnosis not present

## 2022-06-14 DIAGNOSIS — Z0000 Encounter for general adult medical examination without abnormal findings: Secondary | ICD-10-CM

## 2022-06-14 DIAGNOSIS — J302 Other seasonal allergic rhinitis: Secondary | ICD-10-CM

## 2022-06-14 DIAGNOSIS — Z114 Encounter for screening for human immunodeficiency virus [HIV]: Secondary | ICD-10-CM

## 2022-06-14 DIAGNOSIS — E559 Vitamin D deficiency, unspecified: Secondary | ICD-10-CM

## 2022-06-14 DIAGNOSIS — Z1211 Encounter for screening for malignant neoplasm of colon: Secondary | ICD-10-CM

## 2022-06-14 DIAGNOSIS — J4520 Mild intermittent asthma, uncomplicated: Secondary | ICD-10-CM

## 2022-06-14 DIAGNOSIS — Z720 Tobacco use: Secondary | ICD-10-CM

## 2022-06-14 DIAGNOSIS — Z124 Encounter for screening for malignant neoplasm of cervix: Secondary | ICD-10-CM

## 2022-06-14 NOTE — Patient Instructions (Addendum)
-   Obtain fasting labs with orders provided (can have water or black coffee but otherwise no food or drink x 8 hours before labs) - Review information provided - Attend eye doctor annually, dentist every 6 months, work towards or maintain 30 minutes of moderate intensity physical activity at least 5 days per week, and consume a balanced diet - Return in 6 months - Contact us for any questions between now and then  Additionally: - Start home exercises with information provided, focus on steady advance - Can contact us for any persistent neck/hand numbness symptoms you would like addressed

## 2022-06-15 LAB — LIPID PANEL
Chol/HDL Ratio: 4 ratio (ref 0.0–4.4)
Cholesterol, Total: 210 mg/dL — ABNORMAL HIGH (ref 100–199)
HDL: 52 mg/dL (ref >39)
LDL Chol Calc (NIH): 130 mg/dL — ABNORMAL HIGH (ref 0–99)
Triglycerides: 155 mg/dL — ABNORMAL HIGH (ref 0–149)
VLDL Cholesterol Cal: 28 mg/dL (ref 5–40)

## 2022-06-15 LAB — TSH: TSH: 4.71 u[IU]/mL — ABNORMAL HIGH (ref 0.450–4.500)

## 2022-06-15 LAB — COMPREHENSIVE METABOLIC PANEL
ALT: 23 IU/L (ref 0–32)
AST: 22 IU/L (ref 0–40)
Albumin/Globulin Ratio: 1.8 (ref 1.2–2.2)
Albumin: 4.4 g/dL (ref 3.9–4.9)
Alkaline Phosphatase: 91 IU/L (ref 44–121)
BUN/Creatinine Ratio: 11 (ref 9–23)
BUN: 9 mg/dL (ref 6–24)
Bilirubin Total: 0.4 mg/dL (ref 0.0–1.2)
CO2: 20 mmol/L (ref 20–29)
Calcium: 9.6 mg/dL (ref 8.7–10.2)
Chloride: 98 mmol/L (ref 96–106)
Creatinine, Ser: 0.84 mg/dL (ref 0.57–1.00)
Globulin, Total: 2.4 g/dL (ref 1.5–4.5)
Glucose: 103 mg/dL — ABNORMAL HIGH (ref 70–99)
Potassium: 4.8 mmol/L (ref 3.5–5.2)
Sodium: 138 mmol/L (ref 134–144)
Total Protein: 6.8 g/dL (ref 6.0–8.5)
eGFR: 86 mL/min/{1.73_m2} (ref >59)

## 2022-06-15 LAB — CBC
Hematocrit: 43.8 % (ref 34.0–46.6)
Hemoglobin: 14.2 g/dL (ref 11.1–15.9)
MCH: 31.2 pg (ref 26.6–33.0)
MCHC: 32.4 g/dL (ref 31.5–35.7)
MCV: 96 fL (ref 79–97)
Platelets: 341 10*3/uL (ref 150–450)
RBC: 4.55 x10E6/uL (ref 3.77–5.28)
RDW: 13.6 % (ref 11.7–15.4)
WBC: 14.1 10*3/uL — ABNORMAL HIGH (ref 3.4–10.8)

## 2022-06-15 LAB — VITAMIN D 25 HYDROXY (VIT D DEFICIENCY, FRACTURES): Vit D, 25-Hydroxy: 37.9 ng/mL (ref 30.0–100.0)

## 2022-06-18 NOTE — Progress Notes (Signed)
A1C added T3 and T4- R73.09, R73.89.  KP

## 2022-06-19 LAB — HGB A1C W/O EAG: Hgb A1c MFr Bld: 5.8 % — ABNORMAL HIGH (ref 4.8–5.6)

## 2022-06-19 LAB — SPECIMEN STATUS REPORT

## 2022-06-19 LAB — T3, FREE: T3, Free: 3.2 pg/mL (ref 2.0–4.4)

## 2022-06-19 LAB — T4, FREE: Free T4: 1.06 ng/dL (ref 0.82–1.77)

## 2022-06-26 ENCOUNTER — Encounter: Payer: Self-pay | Admitting: Family Medicine

## 2022-06-26 DIAGNOSIS — Z0000 Encounter for general adult medical examination without abnormal findings: Secondary | ICD-10-CM | POA: Insufficient documentation

## 2022-06-26 NOTE — Assessment & Plan Note (Signed)
Annual examination completed, risk stratification labs ordered, anticipatory guidance provided.  We will follow labs once resulted. 

## 2022-06-26 NOTE — Assessment & Plan Note (Signed)
At this stage having stable symptoms, home exercise regimen advised. Did discuss further evaluation by spine group but will await patient's response to proceed with that.

## 2022-06-26 NOTE — Assessment & Plan Note (Signed)
Chronic, stable on current regimen.  

## 2022-06-26 NOTE — Assessment & Plan Note (Signed)
Stable on current regimen   

## 2022-06-26 NOTE — Assessment & Plan Note (Signed)
Discussed pharmacotherapy options, not ready at this stage to proceed with cessation.

## 2022-06-26 NOTE — Progress Notes (Signed)
Annual Physical Exam Visit  Patient Information:  Patient ID: Christine Boyle, female DOB: 09/20/1975 Age: 47 y.o. MRN: [id number]   Subjective:   CC: Annual Physical Exam  HPI:  Christine Boyle is here for their annual physical.  I reviewed the past medical history, family history, social history, surgical history, and allergies today and changes were made as necessary.  Please see the problem list section below for additional details.  Past Medical History: Past Medical History:  Diagnosis Date   Asthma    Past Surgical History: Past Surgical History:  Procedure Laterality Date   CESAREAN SECTION  2003   CHOLECYSTECTOMY  05/2002   TONSILLECTOMY  1995   WISDOM TOOTH EXTRACTION  1998   Family History: Family History  Problem Relation Age of Onset   Cancer Mother        Pancreas/Lung/Brain   Heart defect Mother    Allergies: No Known Allergies Health Maintenance: Health Maintenance  Topic Date Due   PAP SMEAR-Modifier  09/02/2024   DTaP/Tdap/Td (2 - Td or Tdap) 09/29/2026   COLONOSCOPY (Pts 45-31yr Insurance coverage will need to be confirmed)  05/24/2032   INFLUENZA VACCINE  Completed   COVID-19 Vaccine  Completed   Hepatitis C Screening  Completed   HIV Screening  Completed   HPV VACCINES  Aged Out    HM Colonoscopy          COLONOSCOPY (Pts 45-443yrInsurance coverage will need to be confirmed) (Every 10 Years) Next due on 05/24/2032    05/24/2022  HM Colonoscopy component of HM COLONOSCOPY           Medications: Current Outpatient Medications on File Prior to Visit  Medication Sig Dispense Refill   albuterol (VENTOLIN HFA) 108 (90 Base) MCG/ACT inhaler INHALE 1 TO 2 PUFFS INTO THE LUNGS EVERY 6 HOURS AS NEEDED FOR WHEEZING OR SHORTNESS OF BREATH 6.7 g 2   fluticasone-salmeterol (ADVAIR) 250-50 MCG/ACT AEPB Inhale 1 puff into the lungs in the morning and at bedtime. 60 each 2   Magnesium Gluconate 550 MG TABS Take by mouth.     Multiple Vitamin  (MULTI-VITAMINS) TABS Take 1 tablet by mouth daily.     RYALTRIS [id number]/ACT SUSP Place 2 sprays into both nostrils 2 (two) times daily.     buPROPion (WELLBUTRIN SR) 150 MG 12 hr tablet Take 150 mg once daily for 3 days then increase to 150 mg twice daily (Patient not taking: Reported on 06/14/2022) 90 tablet 1   No current facility-administered medications on file prior to visit.    Review of Systems: No headache, visual changes, nausea, vomiting, diarrhea, constipation, dizziness, abdominal pain, skin rash, fevers, chills, night sweats, swollen lymph nodes, weight loss, chest pain, body aches, joint swelling, muscle aches, shortness of breath, mood changes, visual or auditory hallucinations reported.  Objective:   Vitals:   06/14/22 1036  BP: 110/78  Pulse: 82  SpO2: 97%   Vitals:   06/14/22 1036  Weight: 207 lb (93.9 kg)  Height: 5' 6"$  (1.676 m)   Body mass index is 33.41 kg/m.  General: Well Developed, well nourished, and in no acute distress.  Neuro: Alert and oriented x3, extra-ocular muscles intact, sensation grossly intact. Cranial nerves II through XII are grossly intact, motor, sensory, and coordinative functions are intact. HEENT: Normocephalic, atraumatic, pupils equal round reactive to light, neck supple, no masses, no lymphadenopathy, thyroid nonpalpable. Oropharynx, nasopharynx, external ear canals are unremarkable. Skin: Warm and dry, no  rashes noted.  Cardiac: Regular rate and rhythm, no murmurs rubs or gallops. No peripheral edema. Pulses symmetric. Respiratory: Clear to auscultation bilaterally. Not using accessory muscles, speaking in full sentences.  Abdominal: Soft, nontender, nondistended, positive bowel sounds, no masses, no organomegaly. Musculoskeletal: Shoulder, elbow, wrist, hip, knee, ankle stable, and with full range of motion.  Female chaperone initials: KG present throughout the physical examination.  Impression and Recommendations:   The  patient was counselled, risk factors were discussed, and anticipatory guidance given.  Problem List Items Addressed This Visit       Respiratory   Mild intermittent asthma without complication    Chronic, stable on current regimen.        Nervous and Auditory   Right cervical radiculopathy    At this stage having stable symptoms, home exercise regimen advised. Did discuss further evaluation by spine group but will await patient's response to proceed with that.         Other   Tobacco use    Discussed pharmacotherapy options, not ready at this stage to proceed with cessation.      Seasonal allergies    Stable on current regimen.      Annual physical exam - Primary    Annual examination completed, risk stratification labs ordered, anticipatory guidance provided.  We will follow labs once resulted.      Relevant Orders   CBC (Completed)   Comprehensive metabolic panel (Completed)   Lipid panel (Completed)   TSH (Completed)   VITAMIN D 25 Hydroxy (Vit-D Deficiency, Fractures) (Completed)   Other Visit Diagnoses     Encounter for immunization       Relevant Orders   [employer] Fall 2023 Covid-19 Vaccine 36yr and older (Completed)   Screening for lipoid disorders       Relevant Orders   Comprehensive metabolic panel (Completed)   Lipid panel (Completed)   Vitamin D deficiency       Relevant Orders   VITAMIN D 25 Hydroxy (Vit-D Deficiency, Fractures) (Completed)        Orders & Medications Medications: No orders of the defined types were placed in this encounter.  Orders Placed This Encounter  Procedures   [place] Covid-19 Vaccine 122yrand older   HM PAP SMEAR   CBC   Comprehensive metabolic panel   Lipid panel   TSH   VITAMIN D 25 Hydroxy (Vit-D Deficiency, Fractures)   HM HIV SCREENING LAB   HM HEPATITIS C SCREENING LAB   Hgb A1c w/o eAG   T4, free   T3, free   Specimen status report   HM COLONOSCOPY     Return in about 6 months (around  12/13/2022) for CPE.    JaMontel CulverMD, [facility]

## 2022-06-27 DIAGNOSIS — R7303 Prediabetes: Secondary | ICD-10-CM | POA: Insufficient documentation

## 2022-08-17 ENCOUNTER — Encounter: Payer: Self-pay | Admitting: Family Medicine

## 2022-08-19 NOTE — Telephone Encounter (Signed)
Please advise 

## 2022-10-07 ENCOUNTER — Encounter: Payer: Self-pay | Admitting: Family Medicine

## 2022-10-07 NOTE — Telephone Encounter (Signed)
Please advise 

## 2022-10-08 ENCOUNTER — Other Ambulatory Visit: Payer: Self-pay

## 2022-10-08 DIAGNOSIS — J4520 Mild intermittent asthma, uncomplicated: Secondary | ICD-10-CM

## 2022-10-08 DIAGNOSIS — J302 Other seasonal allergic rhinitis: Secondary | ICD-10-CM

## 2022-10-08 MED ORDER — ALBUTEROL SULFATE HFA 108 (90 BASE) MCG/ACT IN AERS
INHALATION_SPRAY | RESPIRATORY_TRACT | 2 refills | Status: DC
Start: 2022-10-08 — End: 2022-10-30

## 2022-10-29 ENCOUNTER — Encounter: Payer: Self-pay | Admitting: Family Medicine

## 2022-10-30 ENCOUNTER — Other Ambulatory Visit: Payer: Self-pay

## 2022-10-30 DIAGNOSIS — J302 Other seasonal allergic rhinitis: Secondary | ICD-10-CM

## 2022-10-30 DIAGNOSIS — J4520 Mild intermittent asthma, uncomplicated: Secondary | ICD-10-CM

## 2022-10-30 MED ORDER — ALBUTEROL SULFATE HFA 108 (90 BASE) MCG/ACT IN AERS
INHALATION_SPRAY | RESPIRATORY_TRACT | 2 refills | Status: DC
Start: 2022-10-30 — End: 2023-02-21

## 2022-10-30 MED ORDER — FLUTICASONE-SALMETEROL 250-50 MCG/ACT IN AEPB
1.0000 | INHALATION_SPRAY | Freq: Two times a day (BID) | RESPIRATORY_TRACT | 2 refills | Status: DC
Start: 2022-10-30 — End: 2023-02-21

## 2022-12-13 ENCOUNTER — Ambulatory Visit (INDEPENDENT_AMBULATORY_CARE_PROVIDER_SITE_OTHER): Payer: 59 | Admitting: Family Medicine

## 2022-12-13 ENCOUNTER — Encounter: Payer: Self-pay | Admitting: Family Medicine

## 2022-12-13 VITALS — BP 128/78 | HR 90 | Ht 66.0 in | Wt 201.0 lb

## 2022-12-13 DIAGNOSIS — Z0000 Encounter for general adult medical examination without abnormal findings: Secondary | ICD-10-CM | POA: Diagnosis not present

## 2022-12-13 DIAGNOSIS — E559 Vitamin D deficiency, unspecified: Secondary | ICD-10-CM

## 2022-12-13 DIAGNOSIS — R7301 Impaired fasting glucose: Secondary | ICD-10-CM

## 2022-12-13 DIAGNOSIS — E782 Mixed hyperlipidemia: Secondary | ICD-10-CM | POA: Diagnosis not present

## 2022-12-13 DIAGNOSIS — Z720 Tobacco use: Secondary | ICD-10-CM

## 2022-12-13 NOTE — Assessment & Plan Note (Addendum)
Did briefly start Wellbutrin for smoking cessation but was unable to be consistent with dosing, wanted to prioritize alcohol cessation - which she has since accomplished. Is amenable to restart Wellbutrin, restart dosing reviewed.

## 2022-12-13 NOTE — Progress Notes (Signed)
Annual Physical Exam Visit  Patient Information:  Patient ID: Christine Boyle, female DOB: [birth year] Age: 47 y.o. MRN: [id number]   Subjective:   CC: Annual Physical Exam  HPI:  Christine Boyle is here for their annual physical.  I reviewed the past medical history, family history, social history, surgical history, and allergies today and changes were made as necessary.  Please see the problem list section below for additional details.  Past Medical History: Past Medical History:  Diagnosis Date   Asthma    Past Surgical History: Past Surgical History:  Procedure Laterality Date   CESAREAN SECTION  2003   CHOLECYSTECTOMY  05/2002   TONSILLECTOMY  1995   WISDOM TOOTH EXTRACTION  1998   Family History: Family History  Problem Relation Age of Onset   Cancer Mother        Pancreas/Lung/Brain   Heart defect Mother    Allergies: No Known Allergies Health Maintenance: Health Maintenance  Topic Date Due   INFLUENZA VACCINE  12/05/2022   PAP SMEAR-Modifier  09/02/2024   DTaP/Tdap/Td (2 - Td or Tdap) 09/29/2026   Colonoscopy  05/24/2032   COVID-19 Vaccine  Completed   Hepatitis C Screening  Completed   HIV Screening  Completed   HPV VACCINES  Aged Out    HM Colonoscopy          Colonoscopy (Every 10 Years) Next due on 05/24/2032    05/24/2022  HM Colonoscopy component of [facility] COLONOSCOPY   Only the first 1 history entries have been loaded, but more history exists.           Medications: Current Outpatient Medications on File Prior to Visit  Medication Sig Dispense Refill   albuterol (VENTOLIN HFA) 108 (90 Base) MCG/ACT inhaler INHALE 1 TO 2 PUFFS INTO THE LUNGS EVERY 6 HOURS AS NEEDED FOR WHEEZING OR SHORTNESS OF BREATH 6.7 g 2   fluticasone-salmeterol (ADVAIR) 250-50 MCG/ACT AEPB Inhale 1 puff into the lungs in the morning and at bedtime. 60 each 2   Magnesium Gluconate 550 MG TABS Take by mouth.     Multiple Vitamin (MULTI-VITAMINS) TABS Take 1 tablet by  mouth daily.     RYALTRIS [id number] MCG/ACT SUSP Place 2 sprays into both nostrils 2 (two) times daily.     buPROPion (WELLBUTRIN SR) 150 MG 12 hr tablet Take 150 mg once daily for 3 days then increase to 150 mg twice daily (Patient not taking: Reported on 06/14/2022) 90 tablet 1   No current facility-administered medications on file prior to visit.    Review of Systems: No headache, visual changes, nausea, vomiting, diarrhea, constipation, dizziness, abdominal pain, skin rash, fevers, chills, night sweats, swollen lymph nodes, weight loss, chest pain, body aches, joint swelling, muscle aches, shortness of breath, mood changes, visual or auditory hallucinations reported.  Objective:   Vitals:   12/13/22 0810  BP: 128/78  Pulse: 90  SpO2: 98%   Vitals:   12/13/22 0810  Weight: 201 lb (91.2 kg)  Height: 5\' 6"  (1.676 m)   Body mass index is 32.44 kg/m.  General: Well Developed, well nourished, and in no acute distress.  Neuro: Alert and oriented x3, extra-ocular muscles intact, sensation grossly intact. Cranial nerves II through XII are grossly intact, motor, sensory, and coordinative functions are intact. HEENT: Normocephalic, atraumatic, pupils equal round reactive to light, neck supple, no masses, no lymphadenopathy, thyroid nonpalpable. Oropharynx, nasopharynx, external ear canals are unremarkable. Skin: Warm and dry, no rashes noted.  Cardiac: Regular rate and rhythm, no murmurs rubs or gallops. No peripheral edema. Pulses symmetric. Respiratory: Clear to auscultation bilaterally. Not using accessory muscles, speaking in full sentences.  Abdominal: Soft, nontender, nondistended, positive bowel sounds, no masses, no organomegaly. Musculoskeletal: Shoulder, elbow, wrist, hip, knee, ankle stable, and with full range of motion.  Female chaperone initials: TL present throughout the physical examination.  Impression and Recommendations:   The patient was counselled, risk factors were  discussed, and anticipatory guidance given.  Problem List Items Addressed This Visit       Other   Tobacco use    Did briefly start Wellbutrin for smoking cessation but was unable to be consistent with dosing, wanted to prioritize alcohol cessation - which she has since accomplished. Is amenable to restart Wellbutrin, restart dosing reviewed.      Healthcare maintenance - Primary   Relevant Orders   CBC   Comprehensive metabolic panel   Lipid panel   TSH   VITAMIN D 25 Hydroxy (Vit-D Deficiency, Fractures)   Hemoglobin A1c   Annual physical exam   Relevant Orders   CBC   Comprehensive metabolic panel   Lipid panel   TSH   VITAMIN D 25 Hydroxy (Vit-D Deficiency, Fractures)   Hemoglobin A1c   Other Visit Diagnoses     Elevated fasting glucose       Relevant Orders   Comprehensive metabolic panel   Hemoglobin A1c   Mixed hyperlipidemia       Relevant Orders   Comprehensive metabolic panel   Lipid panel   Vitamin D deficiency       Relevant Orders   VITAMIN D 25 Hydroxy (Vit-D Deficiency, Fractures)        Orders & Medications Medications: No orders of the defined types were placed in this encounter.  Orders Placed This Encounter  Procedures   CBC   Comprehensive metabolic panel   Lipid panel   TSH   VITAMIN D 25 Hydroxy (Vit-D Deficiency, Fractures)   Hemoglobin A1c     Return in about 1 year (around 12/13/2023).    Jerrol Banana, MD, [facility]   Primary Care Sports Medicine Primary Care and Sports Medicine at [facility]

## 2022-12-13 NOTE — Patient Instructions (Addendum)
-   Obtain fasting labs with orders provided (can have water or black coffee but otherwise no food or drink x 8 hours before labs) - Review information provided - Attend eye doctor annually, dentist every 6 months, work towards or maintain 30 minutes of moderate intensity physical activity at least 5 days per week, and consume a balanced diet - Return in 1 year for physical - Contact us for any questions between now and then  Additionally: - Restart bupropion (Wellbutrin) daily x 3 days then increase to twice daily - Plan to quit on day 7 of the medication - Remain on medication for 2-3 months

## 2022-12-14 LAB — COMPREHENSIVE METABOLIC PANEL WITH GFR
ALT: 53 IU/L — ABNORMAL HIGH (ref 0–32)
AST: 52 IU/L — ABNORMAL HIGH (ref 0–40)
Albumin: 4.2 g/dL (ref 3.9–4.9)
Alkaline Phosphatase: 65 IU/L (ref 44–121)
BUN/Creatinine Ratio: 11 (ref 9–23)
BUN: 8 mg/dL (ref 6–24)
Bilirubin Total: 0.3 mg/dL (ref 0.0–1.2)
CO2: 18 mmol/L — ABNORMAL LOW (ref 20–29)
Calcium: 9.3 mg/dL (ref 8.7–10.2)
Chloride: 103 mmol/L (ref 96–106)
Creatinine, Ser: 0.73 mg/dL (ref 0.57–1.00)
Globulin, Total: 2.6 g/dL (ref 1.5–4.5)
Glucose: 103 mg/dL — ABNORMAL HIGH (ref 70–99)
Potassium: 4.6 mmol/L (ref 3.5–5.2)
Sodium: 137 mmol/L (ref 134–144)
Total Protein: 6.8 g/dL (ref 6.0–8.5)
eGFR: 102 mL/min/1.73 (ref >59)

## 2022-12-14 LAB — LIPID PANEL
Chol/HDL Ratio: 6.2 ratio — ABNORMAL HIGH (ref 0.0–4.4)
Cholesterol, Total: 193 mg/dL (ref 100–199)
HDL: 31 mg/dL — ABNORMAL LOW (ref >39)
LDL Chol Calc (NIH): 134 mg/dL — ABNORMAL HIGH (ref 0–99)
Triglycerides: 153 mg/dL — ABNORMAL HIGH (ref 0–149)
VLDL Cholesterol Cal: 28 mg/dL (ref 5–40)

## 2022-12-14 LAB — VITAMIN D 25 HYDROXY (VIT D DEFICIENCY, FRACTURES): Vit D, 25-Hydroxy: 46.5 ng/mL (ref 30.0–100.0)

## 2022-12-18 ENCOUNTER — Other Ambulatory Visit: Payer: Self-pay | Admitting: Family Medicine

## 2022-12-18 DIAGNOSIS — E782 Mixed hyperlipidemia: Secondary | ICD-10-CM

## 2022-12-18 DIAGNOSIS — R7989 Other specified abnormal findings of blood chemistry: Secondary | ICD-10-CM

## 2022-12-18 DIAGNOSIS — D72829 Elevated white blood cell count, unspecified: Secondary | ICD-10-CM

## 2022-12-18 MED ORDER — ATORVASTATIN CALCIUM 20 MG PO TABS: 20.0000 mg | ORAL_TABLET | Freq: Every day | ORAL | 3 refills | Status: DC

## 2022-12-19 NOTE — Progress Notes (Signed)
Lab added r94.6

## 2022-12-27 LAB — T3 UPTAKE
Free Thyroxine Index: 2.1 (ref 1.2–4.9)
T3 Uptake Ratio: 29 % (ref 24–39)

## 2022-12-27 LAB — SPECIMEN STATUS REPORT

## 2022-12-27 LAB — T4: T4, Total: 7.1 ug/dL (ref 4.5–12.0)

## 2023-02-21 ENCOUNTER — Encounter: Payer: Self-pay | Admitting: Family Medicine

## 2023-02-21 ENCOUNTER — Telehealth (INDEPENDENT_AMBULATORY_CARE_PROVIDER_SITE_OTHER): Payer: 59 | Admitting: Family Medicine

## 2023-02-21 VITALS — Ht 66.0 in | Wt 201.0 lb

## 2023-02-21 DIAGNOSIS — J302 Other seasonal allergic rhinitis: Secondary | ICD-10-CM | POA: Diagnosis not present

## 2023-02-21 DIAGNOSIS — F17210 Nicotine dependence, cigarettes, uncomplicated: Secondary | ICD-10-CM

## 2023-02-21 DIAGNOSIS — J4520 Mild intermittent asthma, uncomplicated: Secondary | ICD-10-CM

## 2023-02-21 DIAGNOSIS — Z720 Tobacco use: Secondary | ICD-10-CM

## 2023-02-21 MED ORDER — FLUTICASONE-SALMETEROL 250-50 MCG/ACT IN AEPB
1.0000 | INHALATION_SPRAY | Freq: Two times a day (BID) | RESPIRATORY_TRACT | 2 refills | Status: DC
Start: 2023-02-21 — End: 2023-05-28

## 2023-02-21 MED ORDER — ALBUTEROL SULFATE HFA 108 (90 BASE) MCG/ACT IN AERS
INHALATION_SPRAY | RESPIRATORY_TRACT | 2 refills | Status: DC
Start: 2023-02-21 — End: 2023-05-28

## 2023-02-21 NOTE — Progress Notes (Unsigned)
Primary Care / Sports Medicine Virtual Visit  Patient Information:  Patient ID: Christine Boyle, female DOB: 1976/01/30 Age: 47 y.o. MRN: [id number]   JAHZARRA DESHAY is a pleasant 47 y.o. female presenting with the following:  No chief complaint on file.   Review of Systems: No fevers, chills, night sweats, weight loss, chest pain, or shortness of breath.   Patient Active Problem List   Diagnosis Date Noted   Healthcare maintenance 12/13/2022   Prediabetes 06/27/2022   Annual physical exam 06/26/2022   Rotator cuff tendinitis, right 04/17/2022   Biceps tendinitis, right 04/17/2022   Depression 03/25/2022   Seasonal allergic rhinitis 03/25/2022   Situational stress 03/25/2022   Right cervical radiculopathy 03/25/2022   Mild intermittent asthma without complication 10/18/2020   Varicose veins of legs 10/18/2020   Other chest pain 08/16/2019   Palpitation 08/16/2019   Tobacco use 08/16/2019   Anxiety 01/07/2014   Obesity 11/19/2013   Seasonal allergies 11/19/2013   Past Medical History:  Diagnosis Date   Asthma    Outpatient Encounter Medications as of 02/21/2023  Medication Sig   albuterol (VENTOLIN HFA) 108 (90 Base) MCG/ACT inhaler INHALE 1 TO 2 PUFFS INTO THE LUNGS EVERY 6 HOURS AS NEEDED FOR WHEEZING OR SHORTNESS OF BREATH   fluticasone-salmeterol (ADVAIR) 250-50 MCG/ACT AEPB Inhale 1 puff into the lungs in the morning and at bedtime.   Magnesium Gluconate 550 MG TABS Take by mouth.   Multiple Vitamin (MULTI-VITAMINS) TABS Take 1 tablet by mouth daily.   RYALTRIS [id number] MCG/ACT SUSP Place 2 sprays into both nostrils 2 (two) times daily.   [DISCONTINUED] albuterol (VENTOLIN HFA) 108 (90 Base) MCG/ACT inhaler INHALE 1 TO 2 PUFFS INTO THE LUNGS EVERY 6 HOURS AS NEEDED FOR WHEEZING OR SHORTNESS OF BREATH   [DISCONTINUED] atorvastatin (LIPITOR) 20 MG tablet Take 1 tablet (20 mg total) by mouth daily.   [DISCONTINUED] buPROPion (WELLBUTRIN SR) 150 MG 12 hr tablet Take  150 mg once daily for 3 days then increase to 150 mg twice daily (Patient not taking: Reported on 06/14/2022)   [DISCONTINUED] fluticasone-salmeterol (ADVAIR) 250-50 MCG/ACT AEPB Inhale 1 puff into the lungs in the morning and at bedtime.   No facility-administered encounter medications on file as of 02/21/2023.   Past Surgical History:  Procedure Laterality Date   CESAREAN SECTION  2003   CHOLECYSTECTOMY  05/2002   TONSILLECTOMY  1995   WISDOM TOOTH EXTRACTION  1998    Virtual Visit via MyChart Video:   I connected with Christine Boyle on 02/22/23 via MyChart Video and verified that I am speaking with the correct person using appropriate identifiers.   The limitations, risks, security and privacy concerns of performing an evaluation and management service by MyChart Video, including the higher likelihood of inaccurate diagnoses and treatments, and the availability of in person appointments were reviewed. The possible need of an additional face-to-face encounter for complete and high quality delivery of care was discussed. The patient was also made aware that there may be a patient responsible charge related to this service. The patient expressed understanding and wishes to proceed.  Provider location is in medical facility. Patient location is at their home, different from provider location. People involved in care of the patient during this telehealth encounter were myself, my nurse/medical assistant, and my front office/scheduling team member.  Objective findings:   General: Speaking full sentences, no audible heavy breathing. Sounds alert and appropriately interactive. Well-appearing. Face symmetric. Extraocular movements intact. Pupils  equal and round. No nasal flaring or accessory muscle use visualized.  Independent interpretation of notes and tests performed by another provider:   None  Pertinent History, Exam, Impression, and Recommendations:   Problem List Items Addressed This  Visit       Respiratory   Seasonal allergic rhinitis   Relevant Medications   fluticasone-salmeterol (ADVAIR) 250-50 MCG/ACT AEPB   albuterol (VENTOLIN HFA) 108 (90 Base) MCG/ACT inhaler   Mild intermittent asthma without complication    Doing reasonably well, will continue current regimen.      Relevant Medications   fluticasone-salmeterol (ADVAIR) 250-50 MCG/ACT AEPB   albuterol (VENTOLIN HFA) 108 (90 Base) MCG/ACT inhaler     Other   Tobacco use - Primary    Was not able to continue Wellbutrin due to tablet form, feeling throat tightness with pills. No liquid formulation available, discussed etiologies for her throat symptoms and options for SSRI liquid. She has noted independent decrease in stress and will work on lifestyle changes to try and quit smoking, has been reducing.  - Can contact us if wanting liquid medications to address stress - Can also contact if wanting to restart Wellbutrin        Orders & Medications Medications:  Meds ordered this encounter  Medications   fluticasone-salmeterol (ADVAIR) 250-50 MCG/ACT AEPB    Sig: Inhale 1 puff into the lungs in the morning and at bedtime.    Dispense:  60 each    Refill:  2   albuterol (VENTOLIN HFA) 108 (90 Base) MCG/ACT inhaler    Sig: INHALE 1 TO 2 PUFFS INTO THE LUNGS EVERY 6 HOURS AS NEEDED FOR WHEEZING OR SHORTNESS OF BREATH    Dispense:  6.7 g    Refill:  2   No orders of the defined types were placed in this encounter.    I discussed the above assessment and treatment plan with the patient. The patient was provided an opportunity to ask questions and all were answered. The patient agreed with the plan and demonstrated an understanding of the instructions.   The patient was advised to call back or seek an in-person evaluation if the symptoms worsen or if the condition fails to improve as anticipated.   I provided a total time of 30 minutes including both face-to-face and non-face-to-face time on 02/22/2023  inclusive of time utilized for medical chart review, information gathering, care coordination with staff, and documentation completion.    Jerrol Banana, MD, [facility]   Primary Care Sports Medicine Primary Care and Sports Medicine at [facility]

## 2023-02-21 NOTE — Patient Instructions (Addendum)
-   Review information attached - If wanting to start sertraline, contact us - Incorporate Metamucil into diet

## 2023-02-22 NOTE — Assessment & Plan Note (Signed)
Was not able to continue Wellbutrin due to tablet form, feeling throat tightness with pills. No liquid formulation available, discussed etiologies for her throat symptoms and options for SSRI liquid. She has noted independent decrease in stress and will work on lifestyle changes to try and quit smoking, has been reducing.  - Can contact us if wanting liquid medications to address stress - Can also contact if wanting to restart Wellbutrin

## 2023-02-22 NOTE — Assessment & Plan Note (Signed)
Doing reasonably well, will continue current regimen.

## 2023-05-21 ENCOUNTER — Ambulatory Visit (INDEPENDENT_AMBULATORY_CARE_PROVIDER_SITE_OTHER): Payer: 59

## 2023-05-21 ENCOUNTER — Ambulatory Visit
Admission: EM | Admit: 2023-05-21 | Discharge: 2023-05-21 | Disposition: A | Discharge status: Home / Self Care | Payer: 59 | Attending: Emergency Medicine | Admitting: Emergency Medicine

## 2023-05-21 DIAGNOSIS — J069 Acute upper respiratory infection, unspecified: Secondary | ICD-10-CM | POA: Insufficient documentation

## 2023-05-21 LAB — RESP PANEL BY RT-PCR (FLU A&B, COVID) ARPGX2
Influenza A by PCR: NEGATIVE
Influenza B by PCR: NEGATIVE
SARS Coronavirus 2 by RT PCR: NEGATIVE

## 2023-05-21 MED ORDER — IPRATROPIUM BROMIDE 0.06 % NA SOLN
2.0000 | Freq: Four times a day (QID) | NASAL | 12 refills | Status: DC
Start: 2023-05-21 — End: 2023-05-28

## 2023-05-21 MED ORDER — PROMETHAZINE-DM 6.25-15 MG/5ML PO SYRP: 5.0000 mL | ORAL_SOLUTION | Freq: Four times a day (QID) | ORAL | 0 refills | Status: DC | PRN

## 2023-05-21 MED ORDER — BENZONATATE 100 MG PO CAPS: 200.0000 mg | ORAL_CAPSULE | Freq: Three times a day (TID) | ORAL | 0 refills | Status: DC

## 2023-05-21 NOTE — ED Provider Notes (Addendum)
 [facility]    CSN: [id number] Arrival date & time: 05/21/23  1441      History   Chief Complaint Chief Complaint  Patient presents with   Facial Pain   Emesis   Cough   Headache    HPI Christine Boyle is a 48 y.o. female.   HPI  48 year old female with a past medical history significant for asthma, prediabetes, anxiety, depression presents for evaluation of respiratory symptoms that began 3 days ago as facial pressure and bodyaches.  Yesterday she developed runny nose, nasal congestion, ear pain, nonproductive cough, and headache.  She states that she has shortness of breath and wheezing at baseline due to her asthma but has not noticed any increase in her symptoms.  She denies any fever or sore throat.  Past Medical History:  Diagnosis Date   Asthma     Patient Active Problem List   Diagnosis Date Noted   Healthcare maintenance 12/13/2022   Prediabetes 06/27/2022   Annual physical exam 06/26/2022   Rotator cuff tendinitis, right 04/17/2022   Biceps tendinitis, right 04/17/2022   Depression 03/25/2022   Seasonal allergic rhinitis 03/25/2022   Situational stress 03/25/2022   Right cervical radiculopathy 03/25/2022   Mild intermittent asthma without complication 10/18/2020   Varicose veins of legs 10/18/2020   Other chest pain 08/16/2019   Palpitation 08/16/2019   Tobacco use 08/16/2019   Anxiety 01/07/2014   Obesity 11/19/2013   Seasonal allergies 11/19/2013    Past Surgical History:  Procedure Laterality Date   CESAREAN SECTION  2003   CHOLECYSTECTOMY  05/2002   TONSILLECTOMY  1995   WISDOM TOOTH EXTRACTION  1998    OB History   No obstetric history on file.      Home Medications    Prior to Admission medications   Medication Sig Start Date End Date Taking? Authorizing Provider  benzonatate  (TESSALON ) 100 MG capsule Take 2 capsules (200 mg total) by mouth every 8 (eight) hours. 05/21/23  Yes Kent Pear, NP  ipratropium (ATROVENT )  0.06 % nasal spray Place 2 sprays into both nostrils 4 (four) times daily. 05/21/23  Yes Kent Pear, NP  promethazine -dextromethorphan (PROMETHAZINE -DM) 6.25-15 MG/5ML syrup Take 5 mLs by mouth 4 (four) times daily as needed. 05/21/23  Yes Kent Pear, NP  albuterol  (VENTOLIN  HFA) 108 (90 Base) MCG/ACT inhaler INHALE 1 TO 2 PUFFS INTO THE LUNGS EVERY 6 HOURS AS NEEDED FOR WHEEZING OR SHORTNESS OF BREATH 02/21/23   Ma Saupe, MD  fluticasone -salmeterol (ADVAIR) 250-50 MCG/ACT AEPB Inhale 1 puff into the lungs in the morning and at bedtime. 02/21/23   Matthews, Jason J, MD  Magnesium Gluconate 550 MG TABS Take by mouth.    [provider]  Multiple Vitamin (MULTI-VITAMINS) TABS Take 1 tablet by mouth daily.    [provider]  RYALTRIS [phone] MCG/ACT SUSP Place 2 sprays into both nostrils 2 (two) times daily. 03/31/22   [provider]    Family History Family History  Problem Relation Age of Onset   Cancer Mother        Pancreas/Lung/Brain   Heart defect Mother     Social History Social History   Tobacco Use   Smoking status: Every Day    Current packs/day: 1.50    Average packs/day: 1.5 packs/day for 31.0 years (46.6 ttl pk-yrs)    Types: Cigarettes    Start date: 1994   Smokeless tobacco: Never  Vaping Use   Vaping status: Never Used  Substance  Use Topics   Alcohol use: Yes    Comment: socially   Drug use: No     Allergies   Patient has no known allergies.   Review of Systems Review of Systems  Constitutional:  Negative for fever.  HENT:  Positive for congestion and rhinorrhea. Negative for ear pain and sore throat.   Respiratory:  Positive for cough, shortness of breath and wheezing.   Musculoskeletal:  Positive for arthralgias and myalgias.  Neurological:  Positive for headaches.     Physical Exam Triage Vital Signs ED Triage Vitals  Encounter Vitals Group     BP      Systolic BP Percentile      Diastolic BP Percentile       Pulse      Resp      Temp      Temp src      SpO2      Weight      Height      Head Circumference      Peak Flow      Pain Score      Pain Loc      Pain Education      Exclude from Growth Chart    No data found.  Updated Vital Signs BP (!) 135/91 (BP Location: Right Arm)   Pulse 61   Temp 97.6 F (36.4 C) (Oral)   Resp 19   LMP 04/29/2023 (Approximate)   SpO2 93%   Visual Acuity Right Eye Distance:   Left Eye Distance:   Bilateral Distance:    Right Eye Near:   Left Eye Near:    Bilateral Near:     Physical Exam Vitals and nursing note reviewed.  Constitutional:      Appearance: Normal appearance. She is not ill-appearing.  HENT:     Head: Normocephalic and atraumatic.     Right Ear: Tympanic membrane, ear canal and external ear normal. There is no impacted cerumen.     Left Ear: Tympanic membrane, ear canal and external ear normal. There is no impacted cerumen.     Nose: Congestion and rhinorrhea present.     Comments: Nasal mucosa is erythematous and edematous with scant clear discharge in both nares.    Mouth/Throat:     Mouth: Mucous membranes are moist.     Pharynx: Oropharynx is clear. Posterior oropharyngeal erythema present. No oropharyngeal exudate.     Comments: Mild erythema to the posterior oropharynx with clear postnasal drip.  Tonsillar pillars are unremarkable. Cardiovascular:     Rate and Rhythm: Normal rate and regular rhythm.     Pulses: Normal pulses.     Heart sounds: Normal heart sounds. No murmur heard.    No friction rub. No gallop.  Pulmonary:     Effort: Pulmonary effort is normal.     Breath sounds: Normal breath sounds. No wheezing, rhonchi or rales.  Musculoskeletal:     Cervical back: Normal range of motion and neck supple. No tenderness.  Lymphadenopathy:     Cervical: No cervical adenopathy.  Skin:    General: Skin is warm and dry.     Capillary Refill: Capillary refill takes less than 2 seconds.     Findings: No rash.   Neurological:     General: No focal deficit present.     Mental Status: She is alert and oriented to person, place, and time.      UC Treatments / Results  Labs (all labs ordered are listed, but  only abnormal results are displayed) Labs Reviewed  RESP PANEL BY RT-PCR (FLU A&B, COVID) [id number]    EKG   Radiology No results found.  Procedures Procedures (including critical care time)  Medications Ordered in UC Medications - No data to display  Initial Impression / Assessment and Plan / UC Course  I have reviewed the triage vital signs and the nursing notes.  Pertinent labs & imaging results that were available during my care of the patient were reviewed by me and considered in my medical decision making (see chart for details).   Patient is a pleasant, nontoxic-appearing 48 year old female presenting for evaluation of respiratory symptoms as outlined in HPI above.  Her physical exam does reveal inflammation of her upper respiratory tract with clear rhinorrhea and clear postnasal drip.  No cervical lymphadenopathy appreciable exam.  Cardiopulmonary exam reveals clear lung sounds diffusely diminished lung she is able to speak in full sentence without dyspnea or tachypnea though her room her oxygen saturation is 93%.  Respiratory rate at triage is 19.  She is afebrile with an oral temp of 97.6.  Patient does have an elevated blood pressure of 162/100 here in clinic but does not carry a diagnosis of high blood pressure.  I will have staff recheck her blood pressure manually.  I will order a respiratory panel today for the presence of COVID or influenza and a chest x-ray to rule out any acute cardiopulmonary pathology.  Chest x-ray independently reviewed and evaluated by me.  Impression: Lung fields are well aerated without evidence of infiltrate or effusion.  Cardiomediastinal silhouette appears normal.  Radiology overread is pending. Radiology impression states no active cardiopulmonary  disease.  Respiratory panel is negative for COVID or influenza.  I will discharge patient home with a diagnosis of viral URI with a cough with prescription for Atrovent  nasal spray to open the nasal congestion and postnasal drip, Tessalon  Perles and Promethazine  DM cough syrup for cough and congestion.   Final Clinical Impressions(s) / UC Diagnoses   Final diagnoses:  Viral URI with cough     Discharge Instructions      Your testing was negative for COVID or influenza and your chest x-ray did not demonstrate any evidence of pneumonia.  I do believe you have a viral respiratory infection causing your symptoms.  Use over-the-counter Tylenol and/or ibuprofen according the package instructions as needed for any fever or bodyaches.  You can prescribe the Atrovent  nasal spray to help with the runny nose and nasal congestion.  You may instill 2 squirts in each nostril every 6 hours as needed.  Hold your Ryaltris while you are using the Atrovent  nasal spray.  Use the Tessalon  Perles every 8 hours during the day as needed for cough.  Take them with a small sip of water.  You may experience numbness to the base of your tongue, or a metallic taste in your mouth, this is normal.  Use the Promethazine  DM cough syrup at bedtime as needed for cough and congestion.  This medication will make you drowsy.  If you develop any new or worsening symptoms please return for reevaluation or see your primary care provider.     ED Prescriptions     Medication Sig Dispense Auth. Provider   benzonatate  (TESSALON ) 100 MG capsule Take 2 capsules (200 mg total) by mouth every 8 (eight) hours. 21 capsule Kent Pear, NP   ipratropium (ATROVENT ) 0.06 % nasal spray Place 2 sprays into both nostrils 4 (four) times daily. 15  mL Kent Pear, NP   promethazine -dextromethorphan (PROMETHAZINE -DM) 6.25-15 MG/5ML syrup Take 5 mLs by mouth 4 (four) times daily as needed. 118 mL Kent Pear, NP      PDMP not reviewed  this encounter.   Kent Pear, NP 05/21/23 1628    Kent Pear, NP 05/21/23 1629    Kent Pear, NP 05/21/23 [phone]

## 2023-05-21 NOTE — ED Triage Notes (Signed)
 Sx started 3 days ago  Facial pain  Cough and headache started yesterday, coughed until she vomited

## 2023-05-21 NOTE — Discharge Instructions (Signed)
 Your testing was negative for COVID or influenza and your chest x-ray did not demonstrate any evidence of pneumonia.  I do believe you have a viral respiratory infection causing your symptoms.  Use over-the-counter Tylenol and/or ibuprofen according the package instructions as needed for any fever or bodyaches.  You can prescribe the Atrovent  nasal spray to help with the runny nose and nasal congestion.  You may instill 2 squirts in each nostril every 6 hours as needed.  Hold your Ryaltris while you are using the Atrovent  nasal spray.  Use the Tessalon  Perles every 8 hours during the day as needed for cough.  Take them with a small sip of water.  You may experience numbness to the base of your tongue, or a metallic taste in your mouth, this is normal.  Use the Promethazine  DM cough syrup at bedtime as needed for cough and congestion.  This medication will make you drowsy.  If you develop any new or worsening symptoms please return for reevaluation or see your primary care provider.

## 2023-05-23 ENCOUNTER — Encounter: Payer: Self-pay | Admitting: Family Medicine

## 2023-05-26 ENCOUNTER — Telehealth: Payer: Self-pay | Admitting: Family Medicine

## 2023-05-26 NOTE — Telephone Encounter (Signed)
Copied from CRM [id number]. Topic: General - Other >> May 26, 2023  2:08 PM Teressa P wrote: Reason for CRM: [place] [facility] with [name] called saying the patient passed away at her home on the 2023-05-29 and her death cert needs to be signed but dr. Ashley Royalty is not on the list to sign for [place] [facility] death certs.  Can Dr. Yetta Barre or Dr. Judithann Graves sign  CB#  [phone]

## 2023-05-28 ENCOUNTER — Telehealth: Payer: Self-pay | Admitting: Family Medicine

## 2023-05-28 ENCOUNTER — Telehealth: Payer: Self-pay

## 2023-05-28 NOTE — Telephone Encounter (Signed)
Spoke with the funeral home and cleared it up directly with them. [facility] is having issues and their help line is down too. I just certified this death a third time and it was received on their end.

## 2023-05-28 NOTE — Telephone Encounter (Signed)
 Copied from CRM [id number]. Topic: General - Other >> May 28, 2023 12:51 PM [name] wrote: Reason for CRM:  [facility] from [name] is needing Christine Boyle to sign her death cert. Please give her a call at [phone].

## 2023-05-28 NOTE — Telephone Encounter (Signed)
Copied from CRM [id number]. Topic: General - Other >> May 28, 2023 11:21 AM Everette C wrote: Reason for CRM: [place] [facility] with [name] has called to request completion of a death certificate for a patient   [id number]  Please contact [place] [facility] further if needed

## 2023-05-28 NOTE — Telephone Encounter (Signed)
Please review.  KP

## 2023-06-07 NOTE — Progress Notes (Signed)
Received call via nurse line connecting to [employer] [place] regarding Christine Boyle having been found deceased at her home by her daughter 05/11/2023. She has been transported to [facility].

## 2023-06-07 DEATH — deceased

## 2023-12-17 ENCOUNTER — Encounter: Payer: 59 | Admitting: Family Medicine
